# Patient Record
Sex: Female | Born: 1945
Health system: Southern US, Community
[De-identification: ages and names within clinical notes are randomized; demographics above are authoritative.]

## PROBLEM LIST (undated history)

## (undated) DIAGNOSIS — M952 Other acquired deformity of head: Secondary | ICD-10-CM

## (undated) DIAGNOSIS — M879 Osteonecrosis, unspecified: Principal | ICD-10-CM

## (undated) DIAGNOSIS — N6452 Nipple discharge: Secondary | ICD-10-CM

## (undated) DIAGNOSIS — N23 Unspecified renal colic: Secondary | ICD-10-CM

## (undated) DIAGNOSIS — K635 Polyp of colon: Secondary | ICD-10-CM

## (undated) DIAGNOSIS — R239 Unspecified skin changes: Secondary | ICD-10-CM

## (undated) DIAGNOSIS — C801 Malignant (primary) neoplasm, unspecified: Secondary | ICD-10-CM

## (undated) DIAGNOSIS — I10 Essential (primary) hypertension: Secondary | ICD-10-CM

## (undated) HISTORY — DX: Polyp of colon: K63.5

## (undated) HISTORY — PX: BREAST SURGERY: SHX581

## (undated) HISTORY — DX: Malignant (primary) neoplasm, unspecified: C80.1

## (undated) HISTORY — DX: Nipple discharge: N64.52

## (undated) HISTORY — PX: APPENDECTOMY: SHX54

## (undated) HISTORY — PX: TONSILLECTOMY: SUR1361

## (undated) HISTORY — PX: PARATHYROIDECTOMY: SHX19

---

## 1997-11-21 ENCOUNTER — Encounter: Payer: Self-pay | Admitting: Internal Medicine

## 1997-11-21 ENCOUNTER — Ambulatory Visit (HOSPITAL_COMMUNITY): Admission: RE | Admit: 1997-11-21 | Discharge: 1997-11-21 | Payer: Self-pay | Admitting: Internal Medicine

## 1998-02-23 ENCOUNTER — Other Ambulatory Visit: Admission: RE | Admit: 1998-02-23 | Discharge: 1998-02-23 | Payer: Self-pay | Admitting: Nurse Practitioner

## 2000-02-10 ENCOUNTER — Other Ambulatory Visit: Admission: RE | Admit: 2000-02-10 | Discharge: 2000-02-10 | Payer: Self-pay | Admitting: Internal Medicine

## 2000-03-26 ENCOUNTER — Ambulatory Visit (HOSPITAL_BASED_OUTPATIENT_CLINIC_OR_DEPARTMENT_OTHER): Admission: RE | Admit: 2000-03-26 | Discharge: 2000-03-26 | Payer: Self-pay | Admitting: General Surgery

## 2000-03-26 ENCOUNTER — Encounter (INDEPENDENT_AMBULATORY_CARE_PROVIDER_SITE_OTHER): Payer: Self-pay | Admitting: Specialist

## 2001-09-15 ENCOUNTER — Ambulatory Visit (HOSPITAL_BASED_OUTPATIENT_CLINIC_OR_DEPARTMENT_OTHER): Admission: RE | Admit: 2001-09-15 | Discharge: 2001-09-15 | Payer: Self-pay | Admitting: Urology

## 2002-04-06 ENCOUNTER — Emergency Department (HOSPITAL_COMMUNITY): Admission: EM | Admit: 2002-04-06 | Discharge: 2002-04-06 | Payer: Self-pay | Admitting: Emergency Medicine

## 2004-10-21 ENCOUNTER — Other Ambulatory Visit: Admission: RE | Admit: 2004-10-21 | Discharge: 2004-10-21 | Payer: Self-pay | Admitting: Obstetrics and Gynecology

## 2004-12-25 ENCOUNTER — Encounter (INDEPENDENT_AMBULATORY_CARE_PROVIDER_SITE_OTHER): Payer: Self-pay | Admitting: *Deleted

## 2004-12-25 ENCOUNTER — Ambulatory Visit (HOSPITAL_COMMUNITY): Admission: RE | Admit: 2004-12-25 | Discharge: 2004-12-25 | Payer: Self-pay | Admitting: Gastroenterology

## 2005-12-15 ENCOUNTER — Other Ambulatory Visit: Admission: RE | Admit: 2005-12-15 | Discharge: 2005-12-15 | Payer: Self-pay | Admitting: Obstetrics and Gynecology

## 2007-01-18 ENCOUNTER — Other Ambulatory Visit: Admission: RE | Admit: 2007-01-18 | Discharge: 2007-01-18 | Payer: Self-pay | Admitting: Obstetrics and Gynecology

## 2007-02-08 ENCOUNTER — Encounter: Admission: RE | Admit: 2007-02-08 | Discharge: 2007-02-08 | Payer: Self-pay | Admitting: Endocrinology

## 2007-04-08 ENCOUNTER — Encounter (INDEPENDENT_AMBULATORY_CARE_PROVIDER_SITE_OTHER): Payer: Self-pay | Admitting: Surgery

## 2007-04-08 ENCOUNTER — Ambulatory Visit (HOSPITAL_COMMUNITY): Admission: RE | Admit: 2007-04-08 | Discharge: 2007-04-09 | Payer: Self-pay | Admitting: Surgery

## 2010-05-01 ENCOUNTER — Other Ambulatory Visit: Payer: Self-pay | Admitting: Radiology

## 2010-05-02 ENCOUNTER — Other Ambulatory Visit: Payer: Self-pay | Admitting: Radiology

## 2010-05-02 DIAGNOSIS — C50911 Malignant neoplasm of unspecified site of right female breast: Secondary | ICD-10-CM

## 2010-05-06 ENCOUNTER — Other Ambulatory Visit: Payer: Self-pay

## 2010-05-08 ENCOUNTER — Ambulatory Visit
Admission: RE | Admit: 2010-05-08 | Discharge: 2010-05-08 | Disposition: A | Discharge status: Home / Self Care | Payer: MEDICARE | Source: Ambulatory Visit | Attending: Radiology | Admitting: Radiology

## 2010-05-08 DIAGNOSIS — C50911 Malignant neoplasm of unspecified site of right female breast: Secondary | ICD-10-CM

## 2010-05-08 MED ORDER — GADOBENATE DIMEGLUMINE 529 MG/ML IV SOLN
18.0000 mL | Freq: Once | INTRAVENOUS | Status: AC | PRN
  Administered 2010-05-08: 18 mL via INTRAVENOUS

## 2010-05-15 ENCOUNTER — Other Ambulatory Visit: Payer: Self-pay | Admitting: Oncology

## 2010-05-15 ENCOUNTER — Other Ambulatory Visit (HOSPITAL_COMMUNITY): Payer: Self-pay | Admitting: General Surgery

## 2010-05-15 ENCOUNTER — Encounter (HOSPITAL_BASED_OUTPATIENT_CLINIC_OR_DEPARTMENT_OTHER): Payer: Medicare Other | Admitting: Oncology

## 2010-05-15 DIAGNOSIS — C50911 Malignant neoplasm of unspecified site of right female breast: Secondary | ICD-10-CM

## 2010-05-15 DIAGNOSIS — C50419 Malignant neoplasm of upper-outer quadrant of unspecified female breast: Secondary | ICD-10-CM

## 2010-05-15 LAB — COMPREHENSIVE METABOLIC PANEL
Albumin: 4.5 g/dL (ref 3.5–5.2)
Alkaline Phosphatase: 97 U/L (ref 39–117)
BUN: 19 mg/dL (ref 6–23)
CO2: 29 mEq/L (ref 19–32)
Calcium: 10.1 mg/dL (ref 8.4–10.5)
Chloride: 100 mEq/L (ref 96–112)
Glucose, Bld: 88 mg/dL (ref 70–99)
Potassium: 4.1 mEq/L (ref 3.5–5.3)
Sodium: 137 mEq/L (ref 135–145)
Total Protein: 7 g/dL (ref 6.0–8.3)

## 2010-05-15 LAB — CANCER ANTIGEN 27.29: CA 27.29: 13 U/mL (ref 0–39)

## 2010-05-15 LAB — CBC WITH DIFFERENTIAL/PLATELET
BASO%: 0.3 % (ref 0.0–2.0)
Basophils Absolute: 0 10*3/uL (ref 0.0–0.1)
EOS%: 1.3 % (ref 0.0–7.0)
Eosinophils Absolute: 0.1 10*3/uL (ref 0.0–0.5)
HCT: 44.9 % (ref 34.8–46.6)
HGB: 15.2 g/dL (ref 11.6–15.9)
LYMPH%: 20.7 % (ref 14.0–49.7)
MCH: 30.9 pg (ref 25.1–34.0)
MCHC: 33.9 g/dL (ref 31.5–36.0)
MCV: 91.1 fL (ref 79.5–101.0)
MONO#: 0.7 10*3/uL (ref 0.1–0.9)
MONO%: 7.2 % (ref 0.0–14.0)
NEUT#: 7.4 10*3/uL — ABNORMAL HIGH (ref 1.5–6.5)
NEUT%: 70.5 % (ref 38.4–76.8)
Platelets: 214 10*3/uL (ref 145–400)
RBC: 4.93 10*6/uL (ref 3.70–5.45)
RDW: 13.9 % (ref 11.2–14.5)
WBC: 10.4 10*3/uL — ABNORMAL HIGH (ref 3.9–10.3)
lymph#: 2.2 10*3/uL (ref 0.9–3.3)

## 2010-05-16 ENCOUNTER — Ambulatory Visit (HOSPITAL_COMMUNITY)
Admission: RE | Admit: 2010-05-16 | Discharge: 2010-05-16 | Disposition: A | Discharge status: Home / Self Care | Payer: Medicare Other | Source: Ambulatory Visit | Attending: General Surgery | Admitting: General Surgery

## 2010-05-16 ENCOUNTER — Other Ambulatory Visit: Payer: Self-pay | Admitting: General Surgery

## 2010-05-16 ENCOUNTER — Ambulatory Visit (HOSPITAL_COMMUNITY): Payer: Medicare Other

## 2010-05-16 DIAGNOSIS — I10 Essential (primary) hypertension: Secondary | ICD-10-CM | POA: Insufficient documentation

## 2010-05-16 DIAGNOSIS — Z01818 Encounter for other preprocedural examination: Secondary | ICD-10-CM | POA: Insufficient documentation

## 2010-05-16 DIAGNOSIS — C50919 Malignant neoplasm of unspecified site of unspecified female breast: Secondary | ICD-10-CM | POA: Insufficient documentation

## 2010-05-16 DIAGNOSIS — C50911 Malignant neoplasm of unspecified site of right female breast: Secondary | ICD-10-CM

## 2010-05-16 LAB — SURGICAL PCR SCREEN: Staphylococcus aureus: NEGATIVE

## 2010-05-16 MED ORDER — TECHNETIUM TC 99M SULFUR COLLOID FILTERED
1.0000 | Freq: Once | INTRAVENOUS | Status: AC | PRN
  Administered 2010-05-16: 1 via INTRADERMAL

## 2010-05-21 NOTE — Op Note (Signed)
Julie [name], Julie [name]                 ACCOUNT NO.:  [id number]   MEDICAL RECORD NO.:  [id number]          PATIENT TYPE:  AMB   LOCATION:  DAY                          FACILITY:  [facility]   PHYSICIAN:  Velora Heckler, MD      DATE OF BIRTH:  Aug 18, 1945   DATE OF PROCEDURE:  04/08/2007  DATE OF DISCHARGE:                               OPERATIVE REPORT   PREOPERATIVE DIAGNOSIS:  Primary hyperparathyroidism.   POSTOPERATIVE DIAGNOSIS:  Primary hyperparathyroidism.   PROCEDURE:  Right inferior minimally-invasive parathyroidectomy.   SURGEON:  Velora Heckler, MD, FACS   ANESTHESIA:  General per Jenelle Mages. Fortune, MD   ESTIMATED BLOOD LOSS:  Minimal.   PREPARATION:  Betadine.   COMPLICATIONS:  None.   INDICATIONS:  The patient is a 65 year old white female from [place],  [place].  The patient was noted to have elevated serum calcium  levels ranging from 10.9 to 11.2.  Intact PTH level was checked and was  elevated at 99.  Bone density scanning showed osteopenia.  Sestamibi  scan was obtained and showed a right inferior adenoma on 1-hour delayed  views.  The patient now comes to surgery for minimally-invasive  parathyroidectomy.   Body.   OF REPORT:  The procedure was done in OR #12 at the [facility].  The patient is brought to the operating room,  placed in a supine position on the operating room table.  Following  administration of general anesthesia, the patient is positioned and then  prepped and draped in the usual strict aseptic fashion.  After  ascertaining that an adequate level of anesthesia had been achieved, an  inferior right neck incision is made with a #15 blade and dissection is  carried through subcutaneous tissues and platysma.  Hemostasis is  obtained with the electrocautery.  Subplatysmal flaps are developed  circumferentially.  A Weitlaner retractor is placed for exposure.  Strap  muscles are incised in the midline and reflected  laterally.  Inferior  pole of the thyroid is identified and mobilized.  Venous tributaries are  divided between small and medium ligaclips.  Dissection in the  tracheoesophageal groove reveals a prominent thyrothymic tract.  The  tract is dissected out and opened.  It contains what appears to be an  enlarged parathyroid gland.  It is moderately firm.  The gland is  resected in its entirety.  Thyrothymic tract is divided between medium  ligaclips.  Gland is excised and submitted fresh to pathology for frozen  section.  Dr. Guerry Bruin performed frozen section and confirms  parathyroid tissue which would be consistent with parathyroid adenoma.  Good hemostasis is obtained in the neck.  Surgicel is placed in the  operative field.  Strap muscles were reapproximated in the midline with  interrupted 3-0 Vicryl sutures.  Platysma is closed with interrupted 3-0  Vicryl sutures.  Skin is anesthetized with local anesthetic.  Skin is  closed with a running 4-0 Monocryl subcuticular suture.  Wound is washed and dried and Benzoin and Steri-Strips are applied.  Sterile dressings are applied.  The  patient is awakened from anesthesia  and brought to the recovery room in stable condition.  The patient  tolerated the procedure well.      Velora Heckler, MD  Electronically Signed     TMG/MEDQ  D:  04/08/2007  T:  04/08/2007  Job:  [id number]   cc:   [name], M.D.  Fax: [phone]   Dorisann Frames, M.D.  Fax: [phone]

## 2010-05-24 NOTE — Op Note (Signed)
Julie [name], Julie [name]                          ACCOUNT NO.:  [id number]   MEDICAL RECORD NO.:  [id number]                   PATIENT TYPE:  AMB   LOCATION:  N[facility]C                                 FACILITY:  [facility]   PHYSICIAN:  Mark C. Vernie Ammons, M.D.               DATE OF BIRTH:  February 14, 1945   DATE OF PROCEDURE:  09/15/2001  DATE OF DISCHARGE:                                 OPERATIVE REPORT   PREOPERATIVE DIAGNOSES:  1. Left hydronephrosis.  2. Ureteral obstruction.   POSTOPERATIVE DIAGNOSES:  1. Left hydronephrosis.  2. Ureteral obstruction.  3. Suspected acute cystitis.   PROCEDURE:  1. Cystoscopy.  2. Left retrograde pyelogram with interpretation.  3. Double-J stent placement.   SURGEON:  Mark C. Vernie Ammons, M.D.   ANESTHESIA:  General.   DRAINS:  6 French 24 cm double-J stent in the left ureter with string.   ESTIMATED BLOOD LOSS:  Less than 5 cc.   SPECIMENS:  Catheterized urine for culture and sensitivity.   COMPLICATIONS:  None.   INDICATIONS FOR PROCEDURE:  The patient is a 65 year old white female, who  presented tomy office yesterday with acute left flank pain, nausea and  vomiting, and an IVP revealed high-grade obstruction on the left-hand side  with pyelocaliectasis.  I could not see a stone.  She returned this morning  and had a miserable night with nausea and vomiting and, I therefore  discussed further evaluation for what I thought was a stone at the time.  Her urinalysis did not look particularly infected in my office, and she was  afebrile at that time as well as today when she arrived for surgery.   DESCRIPTION OF OPERATION:  After informed consent, the patient brought to  the major OR, placed on the table, administered general anesthesia, then  moved to the dorsal lithotomy position.  Genitalia was sterilely prepped and  draped.  I noted a cystocele but a normal-appearing urethral meatus.  When I  inserted the cystoscope into the bladder, foul-smelling  urine returned.  That was obtained for cultures and sensitivity.  The patient did receive 400  mg of Cipro preoperatively.   Inspection of the bladder with the cystoscope revealed multiple red patches  throughout, consistent with acute cystitis.  I did not see any papillary  tumors or other worrisome findings.  The left orifice was identified, and a  5 [place] open-ended ureteral catheter was placed in the opening, and a  retrograde pyelogram was performed in the standard fashion under direct  fluoroscopy.  It revealed normal-appearing ureter of normal caliber and also  what appeared to be no obstruction at this time.  The caliceal system  appeared sharp and unobstructed, but the ureter appeared to have a narrowed  appearance, most consistent with some inflammation.   It was my feeling that ureteroscopy should not be performed in the presence  of suspected bladder infection, and I  therefore passed a 0.038 inch floppy-  tip guidewire through the opening in ureteral catheter and then removed that  catheter and inserted her double-J stent.  Good curl was noted in the renal  pelvis and the bladder, and the patient was then awakened and taken to  recovery room in stable and satisfactory condition.   She will be given a prescription for 500 mg Levaquin to take once a day #14,  Ditropan XL 15 mg q.d. p.r.n. for bladder spasms, and will return to my  office in one week for follow-up.                                               Mark C. Vernie Ammons, M.D.    MCO/MEDQ  D:  09/15/2001  T:  09/16/2001  Job:  [id number]

## 2010-05-24 NOTE — Op Note (Signed)
NAME:  Julie [name], THREATS                 ACCOUNT NO.:  [id number]   MEDICAL RECORD NO.:  [id number]          PATIENT TYPE:  AMB   LOCATION:  ENDO                         FACILITY:  [facility]   PHYSICIAN:  Anselmo Rod, M.D.  DATE OF BIRTH:  [birth year]   DATE OF PROCEDURE:  12/25/2004  DATE OF DISCHARGE:                                 OPERATIVE REPORT   PROCEDURE:  Colonoscopy with snare polypectomy x1 and cold biopsies x4.   ENDOSCOPIST:  Anselmo Rod, M.D.   INSTRUMENT:  Olympus videocolonoscope.   INDICATIONS FOR PROCEDURE:  A 65 year old white female underwent a screening  colonoscopy to rule out chronic polyps, masses, etcetera.   PREPROCEDURE PREPARATION:  Informed consent was procured from the patient.  The patient was fasted for 4 hours prior to the procedure and prepped with  OsmoPrep pills the night of and the morning of the procedure. The risks and  benefits of the procedure including a 10% missed rate of cancer and polyps  was discussed with the patient as well.   PREPROCEDURE PHYSICAL:  VITAL SIGNS: The patient with stable vital signs.  NECK:  Supple.  CHEST:  Clear to auscultation.  HEART:  S1, S2 regular.  ABDOMEN:  Soft with normal bowel sounds.   DESCRIPTION OF PROCEDURE:  The patient was placed in the left lateral  decubitus position and sedated with 75 mcg of Fentanyl and 7.5 mg of Versed  in slow incremental doses. Once the patient was adequately sedated and  maintained on low-flow oxygen, and continuous cardiac monitoring; the  Olympus videocolonoscope was advanced from the rectum to the cecum.  The  appendicular orifice and ileocecal valve were clearly visualized and  photographed.  The terminal ileum appeared normal.  A small sessile polyp  was removed by cold biopsy from the mid right colon. Two small sessile  polyps were biopsied from 90 cm (cold biopsied x2).  A larger sessile polyp  was snared from 90 cm (cold snared x1).  A small sessile polyp was  removed  from the rectum (cold biopsied x1).  Retroflexion in the rectum revealed no abnormalities.  The patient tolerated  the procedure well without complications.  There was no evidence of  diverticulosis.   IMPRESSION:  1.  Multiple small sessile polyps, removed by cold biopsy x4 from the      rectum, one from the mid right colon, and two from 90 cm.  2.  A larger sessile polyp removed by cold snare from 90 cm.   RECOMMENDATIONS:  1.  Await pathology results.  2.  Avoid nonsteroidals including aspirin for the next 4 weeks.  3.  Repeat colonoscopy depending on pathology results.  4.  Outpatient follow up as need arises in the future.      Anselmo Rod, M.D.  Electronically Signed     JNM/MEDQ  D:  12/25/2004  T:  12/27/2004  Job:  [id number]   cc:   Remonia Richter, FNP   Edwena Felty. Romine, M.D.  Fax: [phone]

## 2010-05-29 ENCOUNTER — Ambulatory Visit
Admission: RE | Admit: 2010-05-29 | Discharge: 2010-05-29 | Disposition: A | Discharge status: Home / Self Care | Payer: Medicare Other | Source: Ambulatory Visit | Attending: Radiation Oncology | Admitting: Radiation Oncology

## 2010-05-29 DIAGNOSIS — L589 Radiodermatitis, unspecified: Secondary | ICD-10-CM | POA: Insufficient documentation

## 2010-05-29 DIAGNOSIS — Y842 Radiological procedure and radiotherapy as the cause of abnormal reaction of the patient, or of later complication, without mention of misadventure at the time of the procedure: Secondary | ICD-10-CM | POA: Insufficient documentation

## 2010-05-29 DIAGNOSIS — Z170 Estrogen receptor positive status [ER+]: Secondary | ICD-10-CM | POA: Insufficient documentation

## 2010-05-29 DIAGNOSIS — Z510 Encounter for antineoplastic radiation therapy: Secondary | ICD-10-CM | POA: Insufficient documentation

## 2010-05-29 DIAGNOSIS — N6459 Other signs and symptoms in breast: Secondary | ICD-10-CM | POA: Insufficient documentation

## 2010-05-29 DIAGNOSIS — C50919 Malignant neoplasm of unspecified site of unspecified female breast: Secondary | ICD-10-CM | POA: Insufficient documentation

## 2010-06-06 NOTE — Op Note (Signed)
Julie [name], Julie [name]                 ACCOUNT NO.:  [id number]  MEDICAL RECORD NO.:  [id number]           PATIENT TYPE:  O  LOCATION:  SDSC                         FACILITY:  [facility]  PHYSICIAN:  [name], MD       DATE OF BIRTH:  06-16-1945  DATE OF PROCEDURE:  05/16/2010 DATE OF DISCHARGE:  05/16/2010                              OPERATIVE REPORT   PREOPERATIVE DIAGNOSIS:  Right breast cancer, clinical T1 N0 MX.  POSTOPERATIVE DIAGNOSIS:  Right breast cancer, clinical T1 N0 MX.  PROCEDURE:  Right needle-localized partial mastectomy, right lymphatic mapping, and right axillary sentinel lymph node biopsy.  SURGEON:  [name], MD  ASSISTANT:  None.  ANESTHESIA:  General and local.  FINDINGS:  Two sentinel lymph nodes, #1 was hot with counts per second of approximately 1100.  #2 was palpable, not hot or blue, background was around 50 counts per second. Clip, wire, and mass in the specimen by Faxitron, confirmed by Dr. Rogers Blocker of [facility].  SPECIMENS:  Right axillary sentinel lymph node 1 and 2 and right breast partial mastectomy.  ESTIMATED BLOOD LOSS:  Minimal.  COMPLICATIONS:  None known.  INDICATIONS FOR PROCEDURE:  Ms. Picazo is a 65 year old female who presented with a screening detected right breast mass.  Under biopsy, this was demonstrated to be invasive mammary carcinoma.  She underwent MRI demonstrating no additional suspicious lesions.  She had a mass localized and is for breast conservation treatment today.  PROCEDURE:  Ms. Mcclarty was identified in the holding area and taken to the operating room where she was placed supine on the operating room table.  General anesthesia was induced.  Time-out was performed according to the surgical safety check list.  When all was correct, we continued.  The right breast was injected with blue dye.  The right chest, breasts, and upper arm were prepped and draped in standard fashion.  Time-out was performed  according to the surgical safety check list.  When all was correct, we continued.  The axilla was examined with the NeoProbe, and the region overlying the highest counts per second was opened with a #15 blade.  This was directly at the hairline of the axilla.  The incision made was approximately 3 cm in length.  The subcutaneous tissue in the clavipectoral fascia were divided with the cautery.  A Weitlaner retractor was used to assist with visualization. The NeoProbe was then used to identify the area of maximum intensity and a tonsil was used to bluntly dissect the tissue.  Once a palpable lymph node was identified, this was elevated with a Babcock and dissected with the tonsil.  The clips were placed on the lymphovascular channels going to the lymph node.  Cautery was then used to take the node off these. The counts per second on this node were 1100 approximately.  This was passed off the table.  The axilla was reexamined and the counts per second were 150.  There was one small palpable node that could be located, and this was taken down in a similar fashion.  This was not hot or blue based  on examination.  The axilla was reexamined and no additional palpable or blue nodes were found.  The background remainder 150.  The cavity was then irrigated and hemostasis was achieved with cautery.  The skin was reapproximated with 3-0 Vicryl deep dermal interrupted sutures and 4-0 Monocryl running subcuticular sutures.  The attention was then directed to the breast.  The mammograms were examined and appropriate site for curvilinear incision was located.  This was done in the upper outer quadrant.  The subcutaneous tissues were divided with the cautery and skin hooks were used to elevate the skin and create flaps.  The lateral flap, superior and inferior flap were created thicker.  The medial flap was slightly thinner in order to encompass the graft.  The wire was located with a tonsil and pulled  through the skin. The breast tissue was elevated with two Babcock clamps.  The Mayos were then used to come around the specimen.  This was marked with margin marker paint kit.  Once this was done, it was placed into the Faxitron mammographic specimen container and specimen radiograph was performed. This confirmed that the clip wire and mass were in the tissue.  This was confirmed by Dr. Rogers Blocker as well as Radiology.  The cavity was then irrigated, and hemostasis was achieved with the cautery.  The wound was then closed with 3-0 Vicryl deep dermal sutures and 4-0 Monocryl running subcuticular sutures.  The wounds were then dressed with Steri-Strips gauze and a breast binder.  The patient tolerated the procedure well. She was awakened from anesthesia and taken to the PACU in stable condition.  Needle, sponge, and instrument counts were correct.     [name], MD     FB/MEDQ  D:  05/16/2010  T:  05/17/2010  Job:  [id number]  Electronically Signed by [name] MD on 06/06/2010 10:18:28 AM

## 2010-06-14 ENCOUNTER — Encounter (HOSPITAL_BASED_OUTPATIENT_CLINIC_OR_DEPARTMENT_OTHER): Payer: Medicare Other | Admitting: Oncology

## 2010-06-14 DIAGNOSIS — C50419 Malignant neoplasm of upper-outer quadrant of unspecified female breast: Secondary | ICD-10-CM

## 2010-06-14 DIAGNOSIS — Z170 Estrogen receptor positive status [ER+]: Secondary | ICD-10-CM

## 2010-07-11 ENCOUNTER — Ambulatory Visit
Admission: RE | Admit: 2010-07-11 | Discharge: 2010-07-11 | Disposition: A | Discharge status: Home / Self Care | Payer: Medicare Other | Source: Ambulatory Visit | Admitting: Radiation Oncology

## 2010-08-01 ENCOUNTER — Ambulatory Visit
Admission: RE | Admit: 2010-08-01 | Discharge: 2010-08-01 | Disposition: A | Discharge status: Home / Self Care | Payer: Medicare Other | Source: Ambulatory Visit | Attending: Radiation Oncology | Admitting: Radiation Oncology

## 2010-08-19 ENCOUNTER — Other Ambulatory Visit: Payer: Self-pay | Admitting: Gastroenterology

## 2010-08-21 ENCOUNTER — Encounter (INDEPENDENT_AMBULATORY_CARE_PROVIDER_SITE_OTHER): Payer: Self-pay | Admitting: General Surgery

## 2010-08-21 ENCOUNTER — Ambulatory Visit (INDEPENDENT_AMBULATORY_CARE_PROVIDER_SITE_OTHER): Payer: Medicare Other | Admitting: General Surgery

## 2010-08-21 DIAGNOSIS — C50311 Malignant neoplasm of lower-inner quadrant of right female breast: Secondary | ICD-10-CM | POA: Insufficient documentation

## 2010-08-21 DIAGNOSIS — C50911 Malignant neoplasm of unspecified site of right female breast: Secondary | ICD-10-CM

## 2010-08-21 DIAGNOSIS — C50919 Malignant neoplasm of unspecified site of unspecified female breast: Secondary | ICD-10-CM

## 2010-08-21 NOTE — Assessment & Plan Note (Signed)
R nipple discharge after BCT and radiation. Discharge improving.   Pt doing well otherwise. Follow up with a breast cancer provider every 3 months for 2 years, then every 6 months times 3 years.   Continue anastrazole.

## 2010-08-21 NOTE — Progress Notes (Signed)
Subjective:     Patient ID: Julie [name], female   DOB: 08/22/1945, 65 y.o.   MRN: [id number]  HPI Patient doing well status post breast conservation therapy in May of 2012. She is finished her radiation and is on anastrozole. She has turned having occasional hot flashes. She continues to have some nipple discharge from the right side. This has started to decrease significantly. She had some soreness of the nipple when she bumps it. Her energy has continued to improve.    Review of Systems  All other systems reviewed and are negative.       Objective:   Physical Exam  Constitutional: She is oriented to person, place, and time. She appears well-developed and well-nourished.  HENT:  Head: Normocephalic and atraumatic.  Mouth/Throat: No oropharyngeal exudate.  Eyes: Conjunctivae are normal. Pupils are equal, round, and reactive to light. No scleral icterus.  Neck: Normal range of motion. Neck supple. No tracheal deviation present. No thyromegaly present.  Cardiovascular: Normal rate and regular rhythm.   Pulmonary/Chest: Effort normal. No respiratory distress. She exhibits no tenderness.         Small seroma and area of tenderness.  Abdominal: Soft. She exhibits no distension and no mass. There is no tenderness. There is no rebound and no guarding.  Musculoskeletal: Normal range of motion. She exhibits no edema and no tenderness.  Lymphadenopathy:    She has no cervical adenopathy.  Neurological: She is alert and oriented to person, place, and time. Coordination normal.  Skin: Skin is warm and dry. No rash noted. No erythema. No pallor.  Psychiatric: She has a normal mood and affect. Her behavior is normal. Judgment and thought content normal.       Assessment:     R breast cancer, pT1bN0M0 ER/PR+, her 2neg, ki67 10%, s/p BCT 05/16/2010 R nipple discharge after BCT and radiation. Discharge improving.   Pt doing well otherwise. Follow up with a breast cancer provider every 3  months for 2 years, then every 6 months times 3 years.   Continue anastrazole.        Plan:

## 2010-09-30 LAB — URINALYSIS, ROUTINE W REFLEX MICROSCOPIC
Bilirubin Urine: NEGATIVE
Hgb urine dipstick: NEGATIVE
Ketones, ur: NEGATIVE
Specific Gravity, Urine: 1.013
Urobilinogen, UA: 0.2

## 2010-09-30 LAB — DIFFERENTIAL
Basophils Relative: 1
Eosinophils Absolute: 0.2
Eosinophils Relative: 3
Lymphs Abs: 3.1
Neutrophils Relative %: 53

## 2010-09-30 LAB — BASIC METABOLIC PANEL
BUN: 14
CO2: 26
Chloride: 104
Creatinine, Ser: 0.66
Glucose, Bld: 93
Potassium: 4.4

## 2010-09-30 LAB — PROTIME-INR: Prothrombin Time: 13.1

## 2010-09-30 LAB — URINE MICROSCOPIC-ADD ON

## 2010-09-30 LAB — CBC
HCT: 42.4
MCHC: 34.9
MCV: 84.6
Platelets: 226
RDW: 13.8
WBC: 8.6

## 2010-10-01 LAB — CALCIUM: Calcium: 9.2

## 2010-10-04 ENCOUNTER — Other Ambulatory Visit: Payer: Self-pay | Admitting: Oncology

## 2010-10-04 ENCOUNTER — Encounter (HOSPITAL_BASED_OUTPATIENT_CLINIC_OR_DEPARTMENT_OTHER): Payer: Medicare Other | Admitting: Oncology

## 2010-10-04 DIAGNOSIS — C50419 Malignant neoplasm of upper-outer quadrant of unspecified female breast: Secondary | ICD-10-CM

## 2010-10-04 LAB — CBC WITH DIFFERENTIAL/PLATELET
BASO%: 0.6 % (ref 0.0–2.0)
HCT: 42.9 % (ref 34.8–46.6)
MCHC: 34.4 g/dL (ref 31.5–36.0)
MONO#: 0.4 10*3/uL (ref 0.1–0.9)
NEUT#: 4.3 10*3/uL (ref 1.5–6.5)
RBC: 4.72 10*6/uL (ref 3.70–5.45)
WBC: 6.8 10*3/uL (ref 3.9–10.3)
lymph#: 1.8 10*3/uL (ref 0.9–3.3)

## 2010-10-06 LAB — COMPREHENSIVE METABOLIC PANEL
ALT: 26 U/L (ref 0–35)
Albumin: 4.4 g/dL (ref 3.5–5.2)
CO2: 23 mEq/L (ref 19–32)
Calcium: 9.5 mg/dL (ref 8.4–10.5)
Chloride: 106 mEq/L (ref 96–112)
Sodium: 141 mEq/L (ref 135–145)
Total Protein: 7 g/dL (ref 6.0–8.3)

## 2010-10-06 LAB — VITAMIN D 25 HYDROXY (VIT D DEFICIENCY, FRACTURES): Vit D, 25-Hydroxy: 56 ng/mL (ref 30–89)

## 2011-01-16 ENCOUNTER — Encounter: Payer: Self-pay | Admitting: Radiation Oncology

## 2011-01-16 ENCOUNTER — Telehealth: Payer: Self-pay | Admitting: *Deleted

## 2011-01-16 ENCOUNTER — Ambulatory Visit
Admission: RE | Admit: 2011-01-16 | Discharge: 2011-01-16 | Disposition: A | Discharge status: Home / Self Care | Payer: Medicare Other | Source: Ambulatory Visit | Attending: Radiation Oncology | Admitting: Radiation Oncology

## 2011-01-16 DIAGNOSIS — C50911 Malignant neoplasm of unspecified site of right female breast: Secondary | ICD-10-CM

## 2011-01-16 NOTE — Progress Notes (Signed)
C/o pain on right side at breast, feels like a pulled muscle.  Only feels when she touches it or lays on it.  Skin looks great, do not see anything at area she has pain

## 2011-01-20 NOTE — Progress Notes (Signed)
CC:   Pierce Crane, M.D., F.R.C.P.C. [name], MD [name], M.D.  DIAGNOSIS:  T1b N0 infiltrating ductal carcinoma of the right breast.  PREVIOUS RADIATION:  Adjuvant radiation to a total dose of 42.72 Gy, completed 07/04/2010.  INTERVAL SINCE TREATMENT:  6 months.  INTERVAL HISTORY:  Julie [name] reports for followup today.  She is doing well. Her breast is no longer draining.  She is tolerating her antiestrogen well.  She does have some pulling-type pain when she lays down her right breast or when she uses it a lot.  She describes this as a deep pain.  PHYSICAL EXAMINATION:  General:  She is a pleasant female in no distress, sitting comfortably on the exam room table.  Breasts:  Her right breast is well-healed.  She has no palpable abnormalities.  Her scar looks and feels good.  No palpable axillary adenopathy, no palpable left axillary adenopathy, and nothing palpable in the left breast.  IMPRESSION:  Stage I right breast cancer, status post radiation with no evidence of disease.  RECOMMENDATIONS:  Deriyah looks great.  We talked about the damage to muscles that sometimes occurs with radiation and decreased healing.  We talked about stretching and the use of anti-inflammatories.  I have released her from followup with me.  She is welcome to call at any point if she has any problems in the future.    ______________________________ Lurline Hare, M.D. SW/MEDQ  D:  01/20/2011  T:  01/20/2011  Job:  [id number]

## 2011-02-18 ENCOUNTER — Ambulatory Visit
Admission: RE | Admit: 2011-02-18 | Discharge: 2011-02-18 | Disposition: A | Discharge status: Home / Self Care | Payer: Medicare Other | Source: Ambulatory Visit | Attending: Radiation Oncology | Admitting: Radiation Oncology

## 2011-02-18 ENCOUNTER — Ambulatory Visit (HOSPITAL_COMMUNITY)
Admission: RE | Admit: 2011-02-18 | Discharge: 2011-02-18 | Disposition: A | Discharge status: Home / Self Care | Payer: Medicare Other | Source: Ambulatory Visit | Attending: Radiation Oncology | Admitting: Radiation Oncology

## 2011-02-18 ENCOUNTER — Encounter: Payer: Self-pay | Admitting: Radiation Oncology

## 2011-02-18 VITALS — BP 124/73 | HR 99 | Temp 98.5°F | Resp 18 | Wt 177.4 lb

## 2011-02-18 DIAGNOSIS — Z853 Personal history of malignant neoplasm of breast: Secondary | ICD-10-CM | POA: Insufficient documentation

## 2011-02-18 DIAGNOSIS — R079 Chest pain, unspecified: Secondary | ICD-10-CM | POA: Insufficient documentation

## 2011-02-18 DIAGNOSIS — C50911 Malignant neoplasm of unspecified site of right female breast: Secondary | ICD-10-CM

## 2011-02-18 DIAGNOSIS — I77819 Aortic ectasia, unspecified site: Secondary | ICD-10-CM | POA: Insufficient documentation

## 2011-02-18 NOTE — Progress Notes (Signed)
Here today due to finding a "knot" she found on right lateral chest, painful and radiates around to back when press on it or turn over on it while sleeping.

## 2011-02-28 NOTE — Telephone Encounter (Signed)
xxx

## 2011-03-17 ENCOUNTER — Encounter (INDEPENDENT_AMBULATORY_CARE_PROVIDER_SITE_OTHER): Payer: Self-pay | Admitting: General Surgery

## 2011-03-17 ENCOUNTER — Ambulatory Visit (INDEPENDENT_AMBULATORY_CARE_PROVIDER_SITE_OTHER): Payer: Medicare Other | Admitting: General Surgery

## 2011-03-17 VITALS — BP 128/86 | HR 84 | Temp 97.7°F | Resp 18 | Ht 63.0 in | Wt 177.4 lb

## 2011-03-17 DIAGNOSIS — C50919 Malignant neoplasm of unspecified site of unspecified female breast: Secondary | ICD-10-CM

## 2011-03-17 DIAGNOSIS — C50911 Malignant neoplasm of unspecified site of right female breast: Secondary | ICD-10-CM

## 2011-03-17 NOTE — Progress Notes (Signed)
HISTORY: Pt doing well.  She was previously having bad hot flashes with her arimidex, but these have improved.  She also was having nipple discharge after radiation, which resolved.  She denies breast pain or new masses.  She has not had any weight loss or other new health problems.  She denies issues with her bowels.  She denies fever/ chills.     PERTINENT REVIEW OF SYSTEMS: Otherwise negative.   EXAM: Head: Normocephalic and atraumatic.  Eyes:  Conjunctivae are normal. Pupils are equal, round, and reactive to light. No scleral icterus.  Neck:  Normal range of motion. Neck supple. No tracheal deviation present. No thyromegaly present.  Resp: No respiratory distress, normal effort. Breast:  No masses or skin dimpling found.  No nipple discharge or lymphadenopathy.  The right breast has some thickening at the scar.  She has some sensitivity at her scar.   Abd:  Abdomen is soft, non distended and non tender. No masses are palpable.  There is no rebound and no guarding.  Neurological: Alert and oriented to person, place, and time. Coordination normal.  Skin: Skin is warm and dry. No rash noted. No diaphoretic. No erythema. No pallor.  Psychiatric: Normal mood and affect. Normal behavior. Judgment and thought content normal.      ASSESSMENT AND PLAN:   R breast cancer, pT1bN0M0 ER/PR+, her 2neg, ki67 10%, s/p BCT 05/16/2010 No evidence of malignancy at this time. She will continue on Arimidex She is going to have mammogram soon. Follow up with me in 6 months.        Maudry Diego, MD Surgical Oncology, General & Endocrine Surgery [facility]  [place], PA, PA Yehuda Mao [place], [place]

## 2011-03-17 NOTE — Assessment & Plan Note (Signed)
No evidence of malignancy at this time. She will continue on Arimidex She is going to have mammogram soon. Follow up with me in 6 months.

## 2011-03-17 NOTE — Patient Instructions (Signed)
Follow up with me in 6 months.  Get follow up mammogram as scheduled.

## 2011-03-19 ENCOUNTER — Inpatient Hospital Stay: Admission: RE | Admit: 2011-03-19 | Payer: Medicare Other | Source: Ambulatory Visit | Admitting: Radiation Oncology

## 2011-03-19 ENCOUNTER — Other Ambulatory Visit: Payer: Medicare Other | Admitting: Lab

## 2011-03-20 ENCOUNTER — Ambulatory Visit
Admission: RE | Admit: 2011-03-20 | Discharge: 2011-03-20 | Disposition: A | Discharge status: Home / Self Care | Payer: Medicare Other | Source: Ambulatory Visit | Attending: Radiation Oncology | Admitting: Radiation Oncology

## 2011-03-20 ENCOUNTER — Other Ambulatory Visit: Payer: Self-pay | Admitting: *Deleted

## 2011-03-20 ENCOUNTER — Encounter: Payer: Self-pay | Admitting: Radiation Oncology

## 2011-03-20 VITALS — BP 137/79 | HR 98 | Temp 97.3°F | Resp 18 | Wt 179.1 lb

## 2011-03-20 DIAGNOSIS — C50911 Malignant neoplasm of unspecified site of right female breast: Secondary | ICD-10-CM

## 2011-03-20 NOTE — Progress Notes (Signed)
HERE TODAY FOR FU OF RIGHT BREAST CA.  SKIN LOOKS GREAT , NO C/O.  STATES THAT PAIN AND LUMP ON RIGHT SIDE IS GONE AT THIS POINT ALSO

## 2011-03-20 NOTE — Progress Notes (Signed)
CC:   Julie [name], M.D., F.R.C.P.C. [name], MD [name], M.D.  DIAGNOSIS:  T1b N0 infiltrating ductal carcinoma of the right breast. Previous radiation to a total dose of 42.72 Gy completed 07/04/2010.  INTERVAL SINCE TREATMENT:  6 months.  INTERVAL HISTORY:  Julie [name] reports for followup today.  I last saw her in January when she was complaining of some right breast pain.  We discussed stretching and anti-inflammatories.  That pain has resolved. She is feeling well and is on her antiestrogen with a followup appointment with Dr. Donnie Coffin tomorrow.  She is excited about her upcoming trip to [place] and would like that appointment moved.  She saw Dr. Donell Beers last week.  She has a mammogram scheduled in April.  PHYSICAL EXAMINATION:  General:  She is pleasant female in no distress, sitting comfortably on the exam room table.  Breasts:  Her right breast is well-healed.  Really not much palpable in and around her scar. Nothing palpable in the left breast.  No palpable axillary adenopathy bilaterally.  IMPRESSION:  No evidence of disease.  RECOMMENDATIONS:  [name] looks great.  I will see her back in 9 months. She will see Dr. Donell Beers and Dr. Donnie Coffin in the interim.  We will get her appointment rescheduled Dr. Donnie Coffin.    ______________________________ Lurline Hare, M.D. SW/MEDQ  D:  03/20/2011  T:  03/20/2011  Job:  [id number]

## 2011-03-21 ENCOUNTER — Other Ambulatory Visit: Payer: Medicare Other | Admitting: Lab

## 2011-03-21 ENCOUNTER — Ambulatory Visit: Payer: Medicare Other | Admitting: Oncology

## 2011-03-21 ENCOUNTER — Other Ambulatory Visit: Payer: Self-pay | Admitting: *Deleted

## 2011-03-24 ENCOUNTER — Telehealth: Payer: Self-pay | Admitting: *Deleted

## 2011-03-24 NOTE — Telephone Encounter (Signed)
gave patient appointment for 04-2011  patient  confirmed over the phone the new date and time

## 2011-05-06 ENCOUNTER — Ambulatory Visit: Payer: Medicare Other | Admitting: Oncology

## 2011-05-06 ENCOUNTER — Other Ambulatory Visit: Payer: Medicare Other | Admitting: Lab

## 2011-05-28 ENCOUNTER — Ambulatory Visit
Admission: RE | Admit: 2011-05-28 | Discharge: 2011-05-28 | Disposition: A | Discharge status: Home / Self Care | Payer: Medicare Other | Source: Ambulatory Visit | Attending: Family Medicine | Admitting: Family Medicine

## 2011-05-28 ENCOUNTER — Other Ambulatory Visit: Payer: Self-pay | Admitting: Family Medicine

## 2011-05-28 DIAGNOSIS — R059 Cough, unspecified: Secondary | ICD-10-CM

## 2011-05-28 DIAGNOSIS — R05 Cough: Secondary | ICD-10-CM

## 2011-07-07 ENCOUNTER — Telehealth: Payer: Self-pay | Admitting: *Deleted

## 2011-07-07 NOTE — Telephone Encounter (Signed)
patient aware of the appointment

## 2011-07-17 ENCOUNTER — Ambulatory Visit (HOSPITAL_BASED_OUTPATIENT_CLINIC_OR_DEPARTMENT_OTHER): Payer: Medicare Other | Admitting: Lab

## 2011-07-17 ENCOUNTER — Ambulatory Visit: Payer: Medicare Other | Admitting: Radiation Oncology

## 2011-07-17 ENCOUNTER — Ambulatory Visit (HOSPITAL_BASED_OUTPATIENT_CLINIC_OR_DEPARTMENT_OTHER): Payer: Medicare Other | Admitting: Oncology

## 2011-07-17 ENCOUNTER — Other Ambulatory Visit: Payer: Self-pay | Admitting: *Deleted

## 2011-07-17 VITALS — BP 144/79 | HR 103 | Temp 98.2°F | Ht 63.0 in | Wt 178.9 lb

## 2011-07-17 DIAGNOSIS — C50911 Malignant neoplasm of unspecified site of right female breast: Secondary | ICD-10-CM

## 2011-07-17 DIAGNOSIS — C50419 Malignant neoplasm of upper-outer quadrant of unspecified female breast: Secondary | ICD-10-CM

## 2011-07-17 DIAGNOSIS — E559 Vitamin D deficiency, unspecified: Secondary | ICD-10-CM

## 2011-07-17 DIAGNOSIS — Z170 Estrogen receptor positive status [ER+]: Secondary | ICD-10-CM

## 2011-07-17 LAB — CBC WITH DIFFERENTIAL/PLATELET
Basophils Absolute: 0.1 10*3/uL (ref 0.0–0.1)
Eosinophils Absolute: 0.3 10*3/uL (ref 0.0–0.5)
HGB: 14.3 g/dL (ref 11.6–15.9)
MCV: 90.6 fL (ref 79.5–101.0)
MONO#: 0.6 10*3/uL (ref 0.1–0.9)
MONO%: 8.3 % (ref 0.0–14.0)
NEUT#: 3.9 10*3/uL (ref 1.5–6.5)
Platelets: 182 10*3/uL (ref 145–400)
RBC: 4.7 10*6/uL (ref 3.70–5.45)
RDW: 13.4 % (ref 11.2–14.5)
WBC: 7 10*3/uL (ref 3.9–10.3)

## 2011-07-17 MED ORDER — ANASTROZOLE 1 MG PO TABS: 1.0000 mg | ORAL_TABLET | Freq: Every day | ORAL | Status: DC

## 2011-07-17 NOTE — Progress Notes (Signed)
Hematology and Oncology Follow Up Visit  Julie [name] [id number] 04-Feb-1945 66 y.o. 07/21/2011 1:37 PM   DIAGNOSIS:   Encounter Diagnoses  Name Primary?  . R breast cancer, pT1bN0M0 ER/PR+, her 2neg, ki67 10%, s/p BCT 05/16/2010 Yes  . Unspecified vitamin D deficiency      PAST THERAPY: As above, ration therapy completed 06/26/2010, currently on Arimidex  Interim History:  She has been feeling well. She has no complaints. She is on the radiation oncologist the other day. She is planning a trip to [place]. She is also recently seen the surgeon. She also has a had a mammogram in April which was normal. She is taking additional vitamin D.  Medications: I have reviewed the patient's current medications.  Allergies: No Known Allergies  Past Medical History, Surgical history, Social history, and Family History were reviewed and updated.  Review of Systems: Constitutional:  Negative for fever, chills, night sweats, anorexia, weight loss, pain. Cardiovascular: no chest pain or dyspnea on exertion Respiratory: no cough, shortness of breath, or wheezing Neurological: negative Dermatological: negative ENT: negative Skin Gastrointestinal: negative Genito-Urinary: negative Hematological and Lymphatic: negative Breast: negative Musculoskeletal: negative Remaining ROS negative.  Physical Exam:  Blood pressure 144/79, pulse 103, temperature 98.2 F (36.8 C), height 5\' 3"  (1.6 m), weight 178 lb 14.4 oz (81.149 kg).  ECOG: 0 Physical Exam:  Blood pressure 144/79, pulse 103, temperature 98.2 F (36.8 C), height 5\' 3"  (1.6 m), weight 178 lb 14.4 oz (81.149 kg).  ECOG: 0  HEENT:  Sclerae anicteric, conjunctivae pink.  Oropharynx clear.  No mucositis or candidiasis.  Nodes:  No cervical, supraclavicular, or axillary lymphadenopathy palpated.  Breast Exam:  Right breast is status post lumpectomy and radiation without evidence of recurrence  No masses, discharge, skin change, or  nipple inversion.  Left breast is benign.  No masses, discharge, skin change, or nipple inversion..  Lungs:  Clear to auscultation bilaterally.  No crackles, rhonchi, or wheezes.  Heart:  Regular rate and rhythm.  Abdomen:  Soft, nontender.  Positive bowel sounds.  No organomegaly or masses palpated.  Musculoskeletal:  No focal spinal tenderness to palpation.  Extremities:  Benign.  No peripheral edema or cyanosis.  Skin:  Benign.  Neuro:  Nonfocal.    Lab Results: Lab Results Component Value Date  WBC 7.0 07/17/2011  HGB 14.3 07/17/2011  HCT 42.5 07/17/2011  MCV 90.6 07/17/2011  PLT 182 07/17/2011    Chemistry    Component Value Date/Time  NA 143 07/17/2011 1356  K 3.9 07/17/2011 1356  CL 107 07/17/2011 1356  CO2 24 07/17/2011 1356  BUN 10 07/17/2011 1356  CREATININE 0.87 07/17/2011 1356     Component Value Date/Time  CALCIUM 9.0 07/17/2011 1356  ALKPHOS 93 07/17/2011 1356  AST 29 07/17/2011 1356  ALT 27 07/17/2011 1356  BILITOT 0.8 07/17/2011 1356     Radiological Studies:  No results found.   IMPRESSIONS AND PLAN: A 66 y.o. female with   History of node-negative ER/PR positive breast cancer status post lumpectomy radiation currently on Arimidex. She is doing well without evidence of local recurrence. I will see her in 6 months time.  Spent more than half the time coordinating care, as well as discussion of BMI and its implications.      Julie [name] 7/15/20131:43 PM Cell [phone]

## 2011-07-18 LAB — COMPREHENSIVE METABOLIC PANEL
Albumin: 4.2 g/dL (ref 3.5–5.2)
Alkaline Phosphatase: 93 U/L (ref 39–117)
BUN: 10 mg/dL (ref 6–23)
CO2: 24 mEq/L (ref 19–32)
Calcium: 9 mg/dL (ref 8.4–10.5)
Glucose, Bld: 100 mg/dL — ABNORMAL HIGH (ref 70–99)
Potassium: 3.9 mEq/L (ref 3.5–5.3)
Sodium: 143 mEq/L (ref 135–145)
Total Protein: 6.6 g/dL (ref 6.0–8.3)

## 2011-07-18 LAB — VITAMIN D 25 HYDROXY (VIT D DEFICIENCY, FRACTURES): Vit D, 25-Hydroxy: 78 ng/mL (ref 30–89)

## 2011-09-09 ENCOUNTER — Ambulatory Visit (INDEPENDENT_AMBULATORY_CARE_PROVIDER_SITE_OTHER): Payer: Medicare Other | Admitting: General Surgery

## 2011-09-09 ENCOUNTER — Encounter (INDEPENDENT_AMBULATORY_CARE_PROVIDER_SITE_OTHER): Payer: Self-pay | Admitting: General Surgery

## 2011-09-09 VITALS — BP 130/80 | HR 85 | Temp 97.7°F | Resp 18 | Ht 63.0 in | Wt 178.0 lb

## 2011-09-09 DIAGNOSIS — C50919 Malignant neoplasm of unspecified site of unspecified female breast: Secondary | ICD-10-CM

## 2011-09-09 DIAGNOSIS — C50911 Malignant neoplasm of unspecified site of right female breast: Secondary | ICD-10-CM

## 2011-09-09 NOTE — Progress Notes (Signed)
HISTORY: Patient is a 66 year old female who is now around 15 months postoperatively from the right breast conservation treatment. She had stage I breast cancer. She is doing well overall. She has not noticed any new breast masses. She had her followup mammogram in April which was negative for concerning lesions. She is taking her Arimedex without side effects.  The only thing that she has noticed that is different as issues with her hearing. She describes as being very shaky. She does not feel like her hands particularly weak and is able to squeeze a ball without difficulty. She denies arm swelling. She denies weakness in the rest of her arm. She has not limitation of mobility in her arm.   PERTINENT REVIEW OF SYSTEMS: Otherwise negative.     EXAM: Head: Normocephalic and atraumatic.  Eyes:  Conjunctivae are normal. Pupils are equal, round, and reactive to light. No scleral icterus.  Neck:  Normal range of motion. Neck supple. No tracheal deviation present. No thyromegaly present.  Resp: No respiratory distress, normal effort. Normal breath sounds.   Breast:  R breast slightly smaller post operatively than left breast.  Still slightly sore with thickening at scar.  No masses, no lymphadenopathy.  No nipple retraction or skin dimpling.   CV:  RR&R, no murmurs.   Abd:  Abdomen is soft, non distended and non tender. No masses are palpable.  There is no rebound and no guarding.  Neurological: Alert and oriented to person, place, and time. Coordination normal. R hand with good strength  Skin: Skin is warm and dry. No rash noted. No diaphoretic. No erythema. No pallor.  Psychiatric: Normal mood and affect. Normal behavior. Judgment and thought content normal.      ASSESSMENT AND PLAN:   R breast cancer, pT1bN0M0 ER/PR+, her 2neg, ki67 10%, s/p BCT 05/16/2010 No clinical evidence of recurrent disease.  Continue arimidex per Dr. Donnie Coffin.  Mammogram next year.     Follow up with handwriting  issue with PCP   Maudry Diego, MD Surgical Oncology, General & Endocrine Surgery [facility]  [place], [place] Carilyn [place]past[place], [place]

## 2011-09-09 NOTE — Patient Instructions (Signed)
Continue Arimidex  Continue mammograms.  Discuss handwriting issue with PCP  Follow up in 1 year.

## 2011-09-09 NOTE — Assessment & Plan Note (Signed)
No clinical evidence of recurrent disease.  Continue arimidex per Dr. Donnie Coffin.  Mammogram next year.

## 2011-11-17 ENCOUNTER — Other Ambulatory Visit: Payer: Self-pay | Admitting: *Deleted

## 2011-11-17 DIAGNOSIS — C50919 Malignant neoplasm of unspecified site of unspecified female breast: Secondary | ICD-10-CM

## 2011-11-17 MED ORDER — ANASTROZOLE 1 MG PO TABS: 1.0000 mg | ORAL_TABLET | Freq: Every day | ORAL | Status: DC

## 2011-12-25 ENCOUNTER — Ambulatory Visit: Payer: Medicare Other | Admitting: Radiation Oncology

## 2012-01-14 ENCOUNTER — Telehealth: Payer: Self-pay | Admitting: *Deleted

## 2012-01-14 NOTE — Telephone Encounter (Signed)
MS. Demary called to cancel her appt tomorrow ,she is going out of town, and when she gets back she will call and reschedule to see Dr.Wentworth, informed  Her I would let MD, and her nurse Percell Boston, and scheduler, Otis Peak know as well, 10:45 AM

## 2012-01-15 ENCOUNTER — Ambulatory Visit: Admission: RE | Admit: 2012-01-15 | Payer: Medicare Other | Source: Ambulatory Visit | Admitting: Radiation Oncology

## 2012-02-07 ENCOUNTER — Telehealth: Payer: Self-pay | Admitting: *Deleted

## 2012-02-07 ENCOUNTER — Encounter: Payer: Self-pay | Admitting: Oncology

## 2012-02-07 NOTE — Telephone Encounter (Signed)
Julie [name] called and confirmed 03/09/12 appt w/ pt.  Mailed letter & calendar to pt.

## 2012-03-09 ENCOUNTER — Ambulatory Visit: Payer: Medicare Other | Admitting: Adult Health

## 2012-03-18 ENCOUNTER — Other Ambulatory Visit: Payer: Medicare Other | Admitting: Lab

## 2012-03-18 ENCOUNTER — Ambulatory Visit: Payer: Medicare Other | Admitting: Oncology

## 2012-04-09 ENCOUNTER — Other Ambulatory Visit: Payer: Self-pay | Admitting: Medical Oncology

## 2012-04-09 DIAGNOSIS — C50911 Malignant neoplasm of unspecified site of right female breast: Secondary | ICD-10-CM

## 2012-04-12 ENCOUNTER — Encounter: Payer: Self-pay | Admitting: Adult Health

## 2012-04-12 ENCOUNTER — Other Ambulatory Visit (HOSPITAL_BASED_OUTPATIENT_CLINIC_OR_DEPARTMENT_OTHER): Payer: Medicare Other | Admitting: Lab

## 2012-04-12 ENCOUNTER — Telehealth: Payer: Self-pay | Admitting: Oncology

## 2012-04-12 ENCOUNTER — Ambulatory Visit (HOSPITAL_BASED_OUTPATIENT_CLINIC_OR_DEPARTMENT_OTHER): Payer: Medicare Other | Admitting: Adult Health

## 2012-04-12 VITALS — BP 150/85 | HR 97 | Temp 98.8°F | Resp 20 | Ht 63.0 in | Wt 174.6 lb

## 2012-04-12 DIAGNOSIS — C50419 Malignant neoplasm of upper-outer quadrant of unspecified female breast: Secondary | ICD-10-CM

## 2012-04-12 DIAGNOSIS — Z170 Estrogen receptor positive status [ER+]: Secondary | ICD-10-CM

## 2012-04-12 DIAGNOSIS — C50911 Malignant neoplasm of unspecified site of right female breast: Secondary | ICD-10-CM

## 2012-04-12 DIAGNOSIS — E559 Vitamin D deficiency, unspecified: Secondary | ICD-10-CM

## 2012-04-12 LAB — CBC WITH DIFFERENTIAL/PLATELET
Basophils Absolute: 0 10*3/uL (ref 0.0–0.1)
Eosinophils Absolute: 0.2 10*3/uL (ref 0.0–0.5)
HCT: 43.7 % (ref 34.8–46.6)
HGB: 14.7 g/dL (ref 11.6–15.9)
LYMPH%: 22.1 % (ref 14.0–49.7)
MONO#: 0.5 10*3/uL (ref 0.1–0.9)
NEUT%: 69.8 % (ref 38.4–76.8)
Platelets: 183 10*3/uL (ref 145–400)
WBC: 9.3 10*3/uL (ref 3.9–10.3)
lymph#: 2.1 10*3/uL (ref 0.9–3.3)

## 2012-04-12 LAB — COMPREHENSIVE METABOLIC PANEL (CC13)
ALT: 18 U/L (ref 0–55)
BUN: 16.5 mg/dL (ref 7.0–26.0)
CO2: 23 mEq/L (ref 22–29)
Calcium: 9.1 mg/dL (ref 8.4–10.4)
Chloride: 107 mEq/L (ref 98–107)
Creatinine: 0.9 mg/dL (ref 0.6–1.1)
Glucose: 134 mg/dl — ABNORMAL HIGH (ref 70–99)
Total Bilirubin: 0.97 mg/dL (ref 0.20–1.20)

## 2012-04-12 MED ORDER — ANASTROZOLE 1 MG PO TABS: 1.0000 mg | ORAL_TABLET | Freq: Every day | ORAL | Status: DC

## 2012-04-12 NOTE — Progress Notes (Signed)
OFFICE PROGRESS NOTE  CCCarilyn Goodpasture, PA-C [address] [place] [address]  DIAGNOSIS: 66 y/o with stage IA with ER positive, PR positive, HER-2/neu negative invasive ductal carcinoma of the right breast  PRIOR THERAPY:  1. Patient was initially diagnosed in April 2012 when she had a screening mammogram that showed a mass in the right breast.  Biopsy on 05/01/10 showed an invasive mammary carcinoma ER 100%, PR 60%, HER-2/neu negative, Ki-67 of 10%.  She had a MRI on 05/08/10 that showed a 6mm nodule in the upper outer quadrant of the right breast.   2. She went on to have a lumpectomy by Dr. Donell Beers on 05/16/10 that demonstrated invasive ductal carcinoma, grade 1, 0.8cm and DCIS.  2 sentinel nodes were biopsied and negative.  She had pathologic stage IA breast cancer.  3.  She underwent radiation therapy with Dr. Michell Heinrich from 06/12/10 to 07/04/10.  4. Around 08/2010 she started daily Arimidex.    CURRENT THERAPY: Arimidex daily   INTERVAL HISTORY: Julie [name] 67 y.o. female returns for follow up.  She is doing well today.  She takes the Arimidex daily and tolerates it very well.  She denies hot flashes, dryness, joint aches, and any further concerns.  Her health maintenance information is updated below.  A 10 point ROS is negative.    MEDICAL HISTORY: Past Medical History  Diagnosis Date  . Nipple discharge     right  . Colon polyp   . Cancer     breast rt    ALLERGIES:  has No Known Allergies.  MEDICATIONS:  Current Outpatient Prescriptions  Medication Sig Dispense Refill  . anastrozole (ARIMIDEX) 1 MG tablet Take 1 tablet (1 mg total) by mouth daily.  90 tablet  12  . atorvastatin (LIPITOR) 10 MG tablet Daily.      . Calcium Carbonate-Vitamin D (CALCIUM + D) 600-200 MG-UNIT TABS Take by mouth daily.        . Cholecalciferol (VITAMIN D-3 PO) Take 2,000 Units by mouth daily.        Marland Kitchen lisinopril (PRINIVIL,ZESTRIL) 10 MG tablet Daily.      Marland Kitchen  losartan (COZAAR) 25 MG tablet Take 25 mg by mouth daily.      . Multiple Vitamin (MULTIVITAMIN) capsule Take 1 capsule by mouth daily.         No current facility-administered medications for this visit.    SURGICAL HISTORY:  Past Surgical History  Procedure Laterality Date  . Cesarean section    . Breast surgery      lumpectomy  . Parathyroidectomy    . Appendectomy      REVIEW OF SYSTEMS:   General: fatigue (-), night sweats (-), fever (-), pain (-) Lymph: palpable nodes (-) HEENT: vision changes (-), mucositis (-), gum bleeding (-), epistaxis (-) Cardiovascular: chest pain (-), palpitations (-) Pulmonary: shortness of breath (-), dyspnea on exertion (-), cough (-), hemoptysis (-) GI:  Early satiety (-), melena (-), dysphagia (-), nausea/vomiting (-), diarrhea (-) GU: dysuria (-), hematuria (-), incontinence (-) Musculoskeletal: joint swelling (-), joint pain (-), back pain (-) Neuro: weakness (-), numbness (-), headache (-), confusion (-) Skin: Rash (-), lesions (-), dryness (-) Psych: depression (-), suicidal/homicidal ideation (-), feeling of hopelessness (-)   HEALTH MAINTENANCE:  Mammogram 04/2011 Colonoscopy 07/2010 Bone Density 04/2010 followed by PCP Pap Smear 2 years ago Eye Exam 1 year ago Vitamin D 78 on 07/2011  Lipid Panel 6 months ago  PHYSICAL EXAMINATION:  Blood pressure 150/85, pulse 97, temperature 98.8 F (37.1 C), temperature source Oral, resp. rate 20, height 5\' 3"  (1.6 m), weight 174 lb 9.6 oz (79.198 kg). Body mass index is 30.94 kg/(m^2). General: Patient is a well appearing female in no acute distress HEENT: PERRLA, sclerae anicteric no conjunctival pallor, MMM Neck: supple, no palpable adenopathy Lungs: clear to auscultation bilaterally, no wheezes, rhonchi, or rales Cardiovascular: regular rate rhythm, S1, S2, no murmurs, rubs or gallops Abdomen: Soft, non-tender, non-distended, normoactive bowel sounds, no HSM Extremities: warm and well  perfused, no clubbing, cyanosis, or edema Skin: No rashes or lesions Neuro: Non-focal Breasts: right breast lumpectomy site without nodularity, breast without masses or nodules, left breast no masses or nodularity ECOG PERFORMANCE STATUS: 1 - Symptomatic but completely ambulatory    LABORATORY DATA: Lab Results  Component Value Date   WBC 9.3 04/12/2012   HGB 14.7 04/12/2012   HCT 43.7 04/12/2012   MCV 90.4 04/12/2012   PLT 183 04/12/2012      Chemistry      Component Value Date/Time   NA 142 04/12/2012 1349   NA 143 07/17/2011 1356   K 3.8 04/12/2012 1349   K 3.9 07/17/2011 1356   CL 107 04/12/2012 1349   CL 107 07/17/2011 1356   CO2 23 04/12/2012 1349   CO2 24 07/17/2011 1356   BUN 16.5 04/12/2012 1349   BUN 10 07/17/2011 1356   CREATININE 0.9 04/12/2012 1349   CREATININE 0.87 07/17/2011 1356      Component Value Date/Time   CALCIUM 9.1 04/12/2012 1349   CALCIUM 9.0 07/17/2011 1356   ALKPHOS 110 04/12/2012 1349   ALKPHOS 93 07/17/2011 1356   AST 20 04/12/2012 1349   AST 29 07/17/2011 1356   ALT 18 04/12/2012 1349   ALT 27 07/17/2011 1356   BILITOT 0.97 04/12/2012 1349   BILITOT 0.8 07/17/2011 1356       RADIOGRAPHIC STUDIES:  No results found.  ASSESSMENT:    Patient is a 67 year old with pathologic stage IA ER positive, PR positive, HER-2/neu negative invasive ducal carcinoma of the right breast.  She underwent lumpectomy, radiation therapy and now is on daily Arimidex for almost two years.  We plan to complete 5 years of therapy.     PLAN:   Julie [name] is doing well today.  She will continue daily Arimidex.  No sign of recurrence.  We updated her health maintenance information.  She will continue to follow up as needed.  We discussed self breast exams, diet, exercise today.  She will continue to take daily Vitamin D.  Her mammogram is scheduled, and she will have her bone density soon as well.    All questions were answered. The patient knows to call the clinic with any problems, questions or  concerns. We can certainly see the patient much sooner if necessary.  I spent 40 minutes counseling the patient face to face. The total time spent in the appointment was 60 minutes.  This case was reviewed with Dr. Welton Flakes.   Cherie Ouch Lyn Hollingshead, NP Medical Oncology [facility] Phone: [phone] 04/14/2012, 8:49 AM

## 2012-04-12 NOTE — Patient Instructions (Addendum)
Doing well.  No sign of recurrence.  Continue daily Arimidex.  We will see you back in 6 months.  Please call us if you have any questions or concerns.

## 2012-04-12 NOTE — Telephone Encounter (Signed)
gv pt appt schedule for October.  °

## 2012-07-09 ENCOUNTER — Encounter (HOSPITAL_COMMUNITY): Payer: Self-pay | Admitting: Certified Registered Nurse Anesthetist

## 2012-07-09 ENCOUNTER — Inpatient Hospital Stay: Admit: 2012-07-09 | Payer: Self-pay | Admitting: Urology

## 2012-07-09 ENCOUNTER — Emergency Department (HOSPITAL_COMMUNITY): Payer: Medicare Other | Admitting: Certified Registered Nurse Anesthetist

## 2012-07-09 ENCOUNTER — Inpatient Hospital Stay (HOSPITAL_COMMUNITY)
Admission: EM | Admit: 2012-07-09 | Discharge: 2012-07-11 | DRG: 693 | Disposition: A | Discharge status: Home / Self Care | Payer: Medicare Other | Attending: Urology | Admitting: Urology

## 2012-07-09 ENCOUNTER — Encounter (HOSPITAL_COMMUNITY)
Admission: EM | Disposition: A | Discharge status: Home / Self Care | Payer: Self-pay | Source: Home / Self Care | Attending: Urology

## 2012-07-09 ENCOUNTER — Emergency Department (HOSPITAL_COMMUNITY): Payer: Medicare Other

## 2012-07-09 ENCOUNTER — Encounter (HOSPITAL_COMMUNITY): Payer: Self-pay | Admitting: Emergency Medicine

## 2012-07-09 DIAGNOSIS — Z87891 Personal history of nicotine dependence: Secondary | ICD-10-CM

## 2012-07-09 DIAGNOSIS — A419 Sepsis, unspecified organism: Secondary | ICD-10-CM | POA: Diagnosis present

## 2012-07-09 DIAGNOSIS — N132 Hydronephrosis with renal and ureteral calculous obstruction: Secondary | ICD-10-CM

## 2012-07-09 DIAGNOSIS — R000 Tachycardia, unspecified: Secondary | ICD-10-CM

## 2012-07-09 DIAGNOSIS — Z853 Personal history of malignant neoplasm of breast: Secondary | ICD-10-CM

## 2012-07-09 DIAGNOSIS — R319 Hematuria, unspecified: Secondary | ICD-10-CM

## 2012-07-09 DIAGNOSIS — I959 Hypotension, unspecified: Secondary | ICD-10-CM

## 2012-07-09 DIAGNOSIS — N201 Calculus of ureter: Principal | ICD-10-CM | POA: Diagnosis present

## 2012-07-09 DIAGNOSIS — N12 Tubulo-interstitial nephritis, not specified as acute or chronic: Secondary | ICD-10-CM

## 2012-07-09 DIAGNOSIS — Q619 Cystic kidney disease, unspecified: Secondary | ICD-10-CM

## 2012-07-09 DIAGNOSIS — E785 Hyperlipidemia, unspecified: Secondary | ICD-10-CM | POA: Diagnosis present

## 2012-07-09 DIAGNOSIS — N3090 Cystitis, unspecified without hematuria: Secondary | ICD-10-CM | POA: Diagnosis present

## 2012-07-09 DIAGNOSIS — N1330 Unspecified hydronephrosis: Secondary | ICD-10-CM | POA: Diagnosis present

## 2012-07-09 DIAGNOSIS — Z87442 Personal history of urinary calculi: Secondary | ICD-10-CM

## 2012-07-09 DIAGNOSIS — I10 Essential (primary) hypertension: Secondary | ICD-10-CM | POA: Diagnosis present

## 2012-07-09 HISTORY — PX: CYSTOSCOPY WITH STENT PLACEMENT: SHX5790

## 2012-07-09 LAB — BASIC METABOLIC PANEL
BUN: 23 mg/dL (ref 6–23)
Chloride: 106 mEq/L (ref 96–112)
GFR calc Af Amer: 52 mL/min — ABNORMAL LOW (ref >90)
Potassium: 3.9 mEq/L (ref 3.5–5.1)

## 2012-07-09 LAB — CBC WITH DIFFERENTIAL/PLATELET
Basophils Absolute: 0 10*3/uL (ref 0.0–0.1)
Basophils Relative: 0 % (ref 0–1)
Hemoglobin: 14.9 g/dL (ref 12.0–15.0)
MCHC: 34.7 g/dL (ref 30.0–36.0)
Monocytes Relative: 1 % — ABNORMAL LOW (ref 3–12)
Neutro Abs: 7.5 10*3/uL (ref 1.7–7.7)
Neutrophils Relative %: 94 % — ABNORMAL HIGH (ref 43–77)

## 2012-07-09 LAB — URINALYSIS, ROUTINE W REFLEX MICROSCOPIC
Ketones, ur: 15 mg/dL — AB
Nitrite: POSITIVE — AB
Specific Gravity, Urine: 1.022 (ref 1.005–1.030)
pH: 5.5 (ref 5.0–8.0)

## 2012-07-09 LAB — URINE MICROSCOPIC-ADD ON

## 2012-07-09 LAB — CG4 I-STAT (LACTIC ACID): Lactic Acid, Venous: 3.2 mmol/L — ABNORMAL HIGH (ref 0.5–2.2)

## 2012-07-09 SURGERY — CYSTOSCOPY, WITH STENT INSERTION
Anesthesia: General | Site: Ureter | Laterality: Left | Wound class: Clean

## 2012-07-09 MED ORDER — DEXTROSE 5 % IV SOLN
1.0000 g | Freq: Once | INTRAVENOUS | Status: AC
  Administered 2012-07-09: 1 g via INTRAVENOUS
  Filled 2012-07-09: qty 10

## 2012-07-09 MED ORDER — GENTAMICIN SULFATE 40 MG/ML IJ SOLN
400.0000 mg | Freq: Once | INTRAVENOUS | Status: AC
  Administered 2012-07-09: 400 mg via INTRAVENOUS
  Filled 2012-07-09: qty 10

## 2012-07-09 MED ORDER — PROPOFOL 10 MG/ML IV BOLUS
INTRAVENOUS | Status: DC | PRN
  Administered 2012-07-09: 150 mg via INTRAVENOUS

## 2012-07-09 MED ORDER — SCOPOLAMINE 1 MG/3DAYS TD PT72
MEDICATED_PATCH | TRANSDERMAL | Status: DC | PRN
  Administered 2012-07-09: 1 via TRANSDERMAL

## 2012-07-09 MED ORDER — SODIUM CHLORIDE 0.9 % IR SOLN
Status: DC | PRN
  Administered 2012-07-09: 3000 mL

## 2012-07-09 MED ORDER — PHENYLEPHRINE HCL 10 MG/ML IJ SOLN
INTRAMUSCULAR | Status: DC | PRN
  Administered 2012-07-09 (×3): 80 ug via INTRAVENOUS

## 2012-07-09 MED ORDER — SCOPOLAMINE 1 MG/3DAYS TD PT72
MEDICATED_PATCH | TRANSDERMAL | Status: AC
  Filled 2012-07-09: qty 1

## 2012-07-09 MED ORDER — LIDOCAINE HCL (CARDIAC) 20 MG/ML IV SOLN
INTRAVENOUS | Status: DC | PRN
  Administered 2012-07-09: 100 mg via INTRAVENOUS

## 2012-07-09 MED ORDER — SODIUM CHLORIDE 0.9 % IV BOLUS (SEPSIS)
1000.0000 mL | Freq: Once | INTRAVENOUS | Status: AC
  Administered 2012-07-09: 1000 mL via INTRAVENOUS

## 2012-07-09 MED ORDER — OXYCODONE HCL 5 MG/5ML PO SOLN
5.0000 mg | Freq: Once | ORAL | Status: DC | PRN
  Filled 2012-07-09: qty 5

## 2012-07-09 MED ORDER — KCL IN DEXTROSE-NACL 20-5-0.45 MEQ/L-%-% IV SOLN
INTRAVENOUS | Status: DC
  Administered 2012-07-09: 125 mL/h via INTRAVENOUS
  Administered 2012-07-09: 18:00:00 via INTRAVENOUS
  Administered 2012-07-10: 1000 mL via INTRAVENOUS
  Administered 2012-07-11: 05:00:00 via INTRAVENOUS
  Filled 2012-07-09 (×5): qty 1000

## 2012-07-09 MED ORDER — OXYCODONE HCL 5 MG PO TABS: 5.0000 mg | ORAL_TABLET | Freq: Once | ORAL | Status: DC | PRN

## 2012-07-09 MED ORDER — PIPERACILLIN-TAZOBACTAM 3.375 G IVPB
3.3750 g | Freq: Three times a day (TID) | INTRAVENOUS | Status: DC
  Administered 2012-07-09 – 2012-07-11 (×6): 3.375 g via INTRAVENOUS
  Filled 2012-07-09 (×8): qty 50

## 2012-07-09 MED ORDER — METOCLOPRAMIDE HCL 5 MG/ML IJ SOLN
INTRAMUSCULAR | Status: DC | PRN
  Administered 2012-07-09: 10 mg via INTRAVENOUS

## 2012-07-09 MED ORDER — DEXAMETHASONE SODIUM PHOSPHATE 10 MG/ML IJ SOLN
INTRAMUSCULAR | Status: DC | PRN
  Administered 2012-07-09: 10 mg via INTRAVENOUS

## 2012-07-09 MED ORDER — SENNA 8.6 MG PO TABS
1.0000 | ORAL_TABLET | Freq: Two times a day (BID) | ORAL | Status: DC
  Administered 2012-07-09 – 2012-07-11 (×4): 8.6 mg via ORAL
  Filled 2012-07-09 (×4): qty 1

## 2012-07-09 MED ORDER — HEPARIN SODIUM (PORCINE) 5000 UNIT/ML IJ SOLN
5000.0000 [IU] | Freq: Three times a day (TID) | INTRAMUSCULAR | Status: DC
  Administered 2012-07-09 – 2012-07-11 (×5): 5000 [IU] via SUBCUTANEOUS
  Filled 2012-07-09 (×8): qty 1

## 2012-07-09 MED ORDER — HYDROMORPHONE HCL PF 1 MG/ML IJ SOLN
0.5000 mg | Freq: Once | INTRAMUSCULAR | Status: AC
  Administered 2012-07-09: 0.5 mg via INTRAVENOUS
  Filled 2012-07-09: qty 1

## 2012-07-09 MED ORDER — IOHEXOL 300 MG/ML  SOLN
INTRAMUSCULAR | Status: AC
  Filled 2012-07-09: qty 1

## 2012-07-09 MED ORDER — FENTANYL CITRATE 0.05 MG/ML IJ SOLN
INTRAMUSCULAR | Status: DC | PRN
  Administered 2012-07-09: 50 ug via INTRAVENOUS

## 2012-07-09 MED ORDER — ONDANSETRON HCL 4 MG/2ML IJ SOLN
INTRAMUSCULAR | Status: DC | PRN
  Administered 2012-07-09: 4 mg via INTRAVENOUS

## 2012-07-09 MED ORDER — ATORVASTATIN CALCIUM 10 MG PO TABS
10.0000 mg | ORAL_TABLET | Freq: Every day | ORAL | Status: DC
  Administered 2012-07-10 – 2012-07-11 (×2): 10 mg via ORAL
  Filled 2012-07-09 (×3): qty 1

## 2012-07-09 MED ORDER — HYDROMORPHONE HCL PF 1 MG/ML IJ SOLN: 0.5000 mg | INTRAMUSCULAR | Status: DC | PRN

## 2012-07-09 MED ORDER — EPHEDRINE SULFATE 50 MG/ML IJ SOLN
INTRAMUSCULAR | Status: DC | PRN
  Administered 2012-07-09 (×3): 10 mg via INTRAVENOUS

## 2012-07-09 MED ORDER — SODIUM CHLORIDE 0.9 % IV SOLN
INTRAVENOUS | Status: DC
  Administered 2012-07-09: 14:00:00 via INTRAVENOUS

## 2012-07-09 MED ORDER — ONDANSETRON HCL 4 MG/2ML IJ SOLN
4.0000 mg | Freq: Once | INTRAMUSCULAR | Status: AC
  Administered 2012-07-09: 4 mg via INTRAVENOUS
  Filled 2012-07-09: qty 2

## 2012-07-09 MED ORDER — ANASTROZOLE 1 MG PO TABS
1.0000 mg | ORAL_TABLET | Freq: Every day | ORAL | Status: DC
  Administered 2012-07-10 – 2012-07-11 (×2): 1 mg via ORAL
  Filled 2012-07-09 (×3): qty 1

## 2012-07-09 MED ORDER — GENTAMICIN IN SALINE 1.6-0.9 MG/ML-% IV SOLN: 80.0000 mg | Freq: Once | INTRAVENOUS | Status: DC

## 2012-07-09 MED ORDER — IOHEXOL 300 MG/ML  SOLN
INTRAMUSCULAR | Status: DC | PRN
  Administered 2012-07-09: 10 mL

## 2012-07-09 MED ORDER — HYDROMORPHONE HCL PF 1 MG/ML IJ SOLN: 0.2500 mg | INTRAMUSCULAR | Status: DC | PRN

## 2012-07-09 MED ORDER — ACETAMINOPHEN 10 MG/ML IV SOLN
1000.0000 mg | Freq: Once | INTRAVENOUS | Status: DC | PRN
  Filled 2012-07-09: qty 100

## 2012-07-09 MED ORDER — OXYCODONE-ACETAMINOPHEN 5-325 MG PO TABS
1.0000 | ORAL_TABLET | ORAL | Status: DC | PRN
  Administered 2012-07-10: 1 via ORAL
  Filled 2012-07-09: qty 1

## 2012-07-09 MED ORDER — ONDANSETRON HCL 4 MG/2ML IJ SOLN
4.0000 mg | INTRAMUSCULAR | Status: DC | PRN
Start: 2012-07-09 — End: 2012-07-11

## 2012-07-09 MED ORDER — MIDAZOLAM HCL 5 MG/5ML IJ SOLN
INTRAMUSCULAR | Status: DC | PRN
  Administered 2012-07-09: 2 mg via INTRAVENOUS

## 2012-07-09 MED ORDER — MEPERIDINE HCL 50 MG/ML IJ SOLN: 6.2500 mg | INTRAMUSCULAR | Status: DC | PRN

## 2012-07-09 MED ORDER — KCL IN DEXTROSE-NACL 20-5-0.45 MEQ/L-%-% IV SOLN
INTRAVENOUS | Status: AC
  Filled 2012-07-09: qty 1000

## 2012-07-09 MED ORDER — PROMETHAZINE HCL 25 MG/ML IJ SOLN: 6.2500 mg | INTRAMUSCULAR | Status: DC | PRN

## 2012-07-09 MED ORDER — DOCUSATE SODIUM 100 MG PO CAPS
100.0000 mg | ORAL_CAPSULE | Freq: Two times a day (BID) | ORAL | Status: DC
  Administered 2012-07-09 – 2012-07-11 (×4): 100 mg via ORAL
  Filled 2012-07-09 (×5): qty 1

## 2012-07-09 SURGICAL SUPPLY — 20 items
ADAPTER CATH URET PLST 4-6FR (CATHETERS) ×2 IMPLANT
ADPR CATH URET STRL DISP 4-6FR (CATHETERS) ×1
BAG URINE DRAINAGE (UROLOGICAL SUPPLIES) ×2 IMPLANT
BAG URO CATCHER STRL LF (DRAPE) ×2 IMPLANT
BASKET ZERO TIP NITINOL 2.4FR (BASKET) IMPLANT
CATH FOLEY 2WAY SLVR  5CC 16FR (CATHETERS) ×1
CATH FOLEY 2WAY SLVR 5CC 16FR (CATHETERS) ×1 IMPLANT
CATH INTERMIT  6FR 70CM (CATHETERS) ×2 IMPLANT
CLOTH BEACON ORANGE TIMEOUT ST (SAFETY) ×2 IMPLANT
DRAPE CAMERA CLOSED 9X96 (DRAPES) ×2 IMPLANT
GLOVE BIOGEL M STRL SZ7.5 (GLOVE) ×2 IMPLANT
GOWN STRL NON-REIN LRG LVL3 (GOWN DISPOSABLE) ×2 IMPLANT
GOWN STRL REIN XL XLG (GOWN DISPOSABLE) ×2 IMPLANT
GUIDEWIRE ANG ZIPWIRE 038X150 (WIRE) ×2 IMPLANT
GUIDEWIRE STR DUAL SENSOR (WIRE) ×2 IMPLANT
HOLDER FOLEY CATH W/STRAP (MISCELLANEOUS) ×2 IMPLANT
MANIFOLD NEPTUNE II (INSTRUMENTS) ×2 IMPLANT
PACK CYSTO (CUSTOM PROCEDURE TRAY) ×2 IMPLANT
STENT CONTOUR 6FRX24X.038 (STENTS) ×2 IMPLANT
TUBING CONNECTING 10 (TUBING) ×2 IMPLANT

## 2012-07-09 NOTE — ED Notes (Signed)
Pt. Stated, i started having to use the BR frquently a couple of weeks ago, and last night I started having pain when i urinate with blood.

## 2012-07-09 NOTE — ED Provider Notes (Signed)
History    CSN: [id number] Arrival date & time 07/09/12  1119  First MD Initiated Contact with Patient 07/09/12 1149     Chief Complaint  Patient presents with  . Hematuria   (Consider location/radiation/quality/duration/timing/severity/associated sxs/prior Treatment) Patient is a 67 y.o. female presenting with hematuria. The history is provided by the patient and the spouse.  Hematuria Associated symptoms include abdominal pain. Pertinent negatives include no chest pain and no shortness of breath.   the patient with discomfort with urination starting a couple weeks ago. Last May patient started having significant pain with urination and started to have red blood. 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 Ruthann Cancer when in the past. Patient was at a [place] parade today started feeling much worse lightheaded and nauseated. No syncope. No fevers but does have chills. Past Medical History  Diagnosis Date  . Nipple discharge     right  . Colon polyp   . Cancer     breast rt   Past Surgical History  Procedure Laterality Date  . Cesarean section    . Breast surgery      lumpectomy  . Parathyroidectomy    . Appendectomy     Family History  Problem Relation Age of Onset  . Hypertension Mother   . Hyperlipidemia Mother   . Hypertension Father   . Hyperlipidemia Father    History  Substance Use Topics  . Smoking status: Former Games developer  . Smokeless tobacco: Not on file  . Alcohol Use: No   OB History   Grav Para Term Preterm Abortions TAB SAB Ect Mult Living                 Review of Systems  Constitutional: Positive for chills and fatigue. Negative for fever.  HENT: Negative for congestion.   Eyes: Negative for redness.  Respiratory: Negative for shortness of breath.   Cardiovascular: Negative for chest pain.  Gastrointestinal: Positive for nausea and abdominal pain. Negative for vomiting.   Genitourinary: Positive for dysuria and hematuria.  Musculoskeletal: Positive for back pain.  Neurological: Positive for dizziness and light-headedness.    Allergies  Review of patient's allergies indicates no known allergies.  Home Medications   Current Outpatient Rx  Name  Route  Sig  Dispense  Refill  . anastrozole (ARIMIDEX) 1 MG tablet   Oral   Take 1 mg by mouth daily.         Marland Kitchen atorvastatin (LIPITOR) 10 MG tablet   Oral   Take 10 mg by mouth Daily.          . Calcium Carbonate-Vitamin D (CALCIUM + D) 600-200 MG-UNIT TABS   Oral   Take 1 tablet by mouth daily.          . Cholecalciferol (VITAMIN D-3 PO)   Oral   Take 2,000 Units by mouth daily.          Marland Kitchen lisinopril (PRINIVIL,ZESTRIL) 10 MG tablet   Oral   Take 10 mg by mouth Daily.           BP 97/59  Pulse 115  Temp(Src) 98.5 F (36.9 C) (Oral)  Resp 20  SpO2 95% Physical Exam  Nursing note and vitals reviewed. Constitutional: She is oriented to person, place, and time. She appears well-developed and well-nourished. She appears distressed.  HENT:  Head: Normocephalic and atraumatic.  Mouth/Throat: Oropharynx is clear and moist.  Eyes:  Conjunctivae are normal. Pupils are equal, round, and reactive to light.  Neck: Normal range of motion. Neck supple.  Cardiovascular: Normal rate, regular rhythm and normal heart sounds.   No murmur heard. Pulmonary/Chest: Effort normal and breath sounds normal. No respiratory distress.  Abdominal: Soft. Bowel sounds are normal. There is no tenderness.  Musculoskeletal: Normal range of motion. She exhibits no edema.  Neurological: She is alert and oriented to person, place, and time. No cranial nerve deficit. She exhibits normal muscle tone. Coordination normal.  Skin: Skin is warm. No rash noted.    ED Course  Procedures (including critical care time) Labs Reviewed  URINALYSIS, ROUTINE W REFLEX MICROSCOPIC - Abnormal; Notable for the following:    Color,  Urine RED (*)    APPearance TURBID (*)    Hgb urine dipstick LARGE (*)    Bilirubin Urine LARGE (*)    Ketones, ur 15 (*)    Protein, ur >300 (*)    Nitrite POSITIVE (*)    Leukocytes, UA LARGE (*)    All other components within normal limits  CBC WITH DIFFERENTIAL - Abnormal; Notable for the following:    Platelets 147 (*)    Neutrophils Relative % 94 (*)    Lymphocytes Relative 5 (*)    Lymphs Abs 0.4 (*)    Monocytes Relative 1 (*)    Monocytes Absolute 0.0 (*)    All other components within normal limits  BASIC METABOLIC PANEL - Abnormal; Notable for the following:    Creatinine, Ser 1.21 (*)    GFR calc non Af Amer 45 (*)    GFR calc Af Amer 52 (*)    All other components within normal limits  URINE MICROSCOPIC-ADD ON - Abnormal; Notable for the following:    Bacteria, UA MANY (*)    All other components within normal limits  URINE CULTURE   Ct Abdomen Pelvis Wo Contrast  07/09/2012   *RADIOLOGY REPORT*  Clinical Data: Left side abdominal pain, hematuria and chronic UTI  CT ABDOMEN AND PELVIS WITHOUT CONTRAST  Technique:  Multidetector CT imaging of the abdomen and pelvis was performed following the standard protocol without intravenous contrast.  Comparison: None.  Findings:  Lower Chest:  Dependent atelectasis in the lower lobes.  Visualized heart within normal limits for size.  There are coarse calcifications in the coronary arteries.  No pericardial effusion. Calcifications of the aortic valve and mitral annulus. Unremarkable distal thoracic esophagus.  Abdomen: Unenhanced CT was performed per clinician order.  Lack of IV contrast limits sensitivity and specificity, especially for evaluation of abdominal/pelvic solid viscera.  Within these limitations, unremarkable CT appearance of the stomach, duodenum, spleen, adrenal glands and pancreas.  Nonspecific low attenuation soft tissue within the lesser sac interposed between the superior greater curvature of the stomach and the  pancreatic body.  There is some coarse calcifications associated with the edge of this structure. Several small low attenuation lesions are identified scattered throughout the hepatic parenchyma which are incompletely characterized in the absence of intravenous contrast material.  The largest measures 12 mm the anterior aspect of hepatic segment two. Additionally, there are several coarse calcifications within the hepatic parenchyma, likely the sequelae of prior granulomatous disease. Gallbladder is unremarkable. No intra or extrahepatic biliary ductal dilatation.  Moderate left hydronephrosis with high attenuation material in the collecting system consistent with hematuria/thrombus.  There is an obstructing 6.5 by 10 mm stone in the proximal ureter.  Several small parapelvic cysts are noted in the upper  pole.  No definite mass lesion or renal contour abnormality.  There is nonspecific perinephric stranding.  There are several punctate nonobstructing stones on the right.  Numerous parapelvic cysts noted on the right. A single 12 mm low attenuation lesion exophytic from the lateral upper pole which is incompletely characterized in the absence of intravenous contrast material.  Normal-caliber large and small bowel.  No evidence of obstruction. No significant diverticular disease.  Normal appendix in the right lower quadrant.  No free fluid.  Pelvis: The bladder is decompressed.  Unremarkable adnexa and uterus.  No free fluid or suspicious adenopathy.  Bones: No acute fracture or aggressive appearing lytic or blastic osseous lesion.  Multilevel degenerative disc disease and lower lumbar facet arthropathy.  Vascular: Atherosclerotic vascular disease without aneurysmal dilatation.  IMPRESSION:  1.  Obstructing 6.5 x 10 mm proximal left ureteral stone with associated moderate left hydronephrosis and hemorrhage within the proximal dilated collecting system.  Degree of hemorrhage is somewhat more than expected for the  presence of the stone.  An underlying urothelial lesion is difficult to exclude.  Recommend urological consultation.  2.  Low attenuation somewhat amorphous mass in the lesser sac posterior to the pancreas.  The primary differential consideration is a lymphangioma. Recommend further evaluation with non emergent (outpatient) MRI of the abdomen with and without contrast to exclude other less likely neoplastic etiologies.  3.  Atherosclerosis including coronary artery disease  4.  Calcification of the aortic valve and mitral valve annulus  5. Bilateral parapelvic renal cysts.  6.  Low attenuation lesions in the liver and right kidney are too small to characterize but statistically likely cysts.  The cystic nature of these structures could also be confirmed on the follow-up MRI recommended above.   Original Report Authenticated By: Malachy Moan, M.D.   Results for orders placed during the hospital encounter of 07/09/12  URINALYSIS, ROUTINE W REFLEX MICROSCOPIC      Result Value Range   Color, Urine RED (*) YELLOW   APPearance TURBID (*) CLEAR   Specific Gravity, Urine 1.022  1.005 - 1.030   pH 5.5  5.0 - 8.0   Glucose, UA NEGATIVE  NEGATIVE mg/dL   Hgb urine dipstick LARGE (*) NEGATIVE   Bilirubin Urine LARGE (*) NEGATIVE   Ketones, ur 15 (*) NEGATIVE mg/dL   Protein, ur >811 (*) NEGATIVE mg/dL   Urobilinogen, UA 1.0  0.0 - 1.0 mg/dL   Nitrite POSITIVE (*) NEGATIVE   Leukocytes, UA LARGE (*) NEGATIVE  CBC WITH DIFFERENTIAL      Result Value Range   WBC 8.0  4.0 - 10.5 K/uL   RBC 4.78  3.87 - 5.11 MIL/uL   Hemoglobin 14.9  12.0 - 15.0 g/dL   HCT 91.4  78.2 - 95.6 %   MCV 90.0  78.0 - 100.0 fL   MCH 31.2  26.0 - 34.0 pg   MCHC 34.7  30.0 - 36.0 g/dL   RDW 21.3  08.6 - 57.8 %   Platelets 147 (*) 150 - 400 K/uL   Neutrophils Relative % 94 (*) 43 - 77 %   Neutro Abs 7.5  1.7 - 7.7 K/uL   Lymphocytes Relative 5 (*) 12 - 46 %   Lymphs Abs 0.4 (*) 0.7 - 4.0 K/uL   Monocytes Relative 1 (*) 3 -  12 %   Monocytes Absolute 0.0 (*) 0.1 - 1.0 K/uL   Eosinophils Relative 1  0 - 5 %   Eosinophils Absolute 0.0  0.0 -  0.7 K/uL   Basophils Relative 0  0 - 1 %   Basophils Absolute 0.0  0.0 - 0.1 K/uL  BASIC METABOLIC PANEL      Result Value Range   Sodium 141  135 - 145 mEq/L   Potassium 3.9  3.5 - 5.1 mEq/L   Chloride 106  96 - 112 mEq/L   CO2 20  19 - 32 mEq/L   Glucose, Bld 92  70 - 99 mg/dL   BUN 23  6 - 23 mg/dL   Creatinine, Ser 4.54 (*) 0.50 - 1.10 mg/dL   Calcium 9.7  8.4 - 09.8 mg/dL   GFR calc non Af Amer 45 (*) >90 mL/min   GFR calc Af Amer 52 (*) >90 mL/min  URINE MICROSCOPIC-ADD ON      Result Value Range   WBC, UA TOO NUMEROUS TO COUNT  <3 WBC/hpf   RBC / HPF TOO NUMEROUS TO COUNT  <3 RBC/hpf   Bacteria, UA MANY (*) RARE   Urine-Other URINALYSIS PERFORMED ON SUPERNATANT        1. Hematuria   2. Tachycardia   3. Hypotensive episode   4. Pyelonephritis   5. Ureteral stone with hydronephrosis    CRITICAL CARE Performed by: Shelda Jakes. Total critical care time: 30 Critical care time was exclusive of separately billable procedures and treating other patients. Critical care was necessary to treat or prevent imminent or life-threatening deterioration. Critical care was time spent personally by me on the following activities: development of treatment plan with patient and/or surrogate as well as nursing, discussions with consultants, evaluation of patient's response to treatment, examination of patient, obtaining history from patient or surrogate, ordering and performing treatments and interventions, ordering and review of laboratory studies, ordering and review of radiographic studies, pulse oximetry and re-evaluation of patient's condition.   MDM  Patient initially was hypotensive on first set of vital signs and tachycardic with blood pressure 69/36. Heart rate was 123. Afebrile. Oxygen saturation is in the low 90s. Patient has remained that way except for the  blood pressure has been in the 90s since getting back to the room and even prior to having fluids. Patient has received 1 L fluid heart rate has come down to 106. CT scan shows a very large stone in the left kidney with hydronephrosis. Urinalysis consistent with pyelonephritis/urinary tract infection. Patient received fluids patient received 1 g of Rocephin. Patient mentations always been good. Renal function shows a elevation in the creatinine to 1.21. Urology once the patient transferred to [facility] long for which stent placement in the operating room today. Discussed with Dr. Arnoldo Morale. He is aware of her vital signs. Overall patient is more stabilized and when she first arrived still mentating fine. There is a concern for the hypotensive episode but overall she's improved.  Shelda Jakes, MD 07/09/12 [phone]

## 2012-07-09 NOTE — Consult Note (Signed)
Reason for Consult:Left Ureteral Larina Bras, Urosepsis  Referring Physician: Deretha Emory MD  KIMRA KANTOR is an 67 y.o. female.   HPI:   1 - Left Ureteral Stone, Urosepsis - Pt with left proximal ureteral stone (9mm fusiform) with moderate hydro and likely blood products in left renal pelvis and pyuria + tachycardia 120's and hypotention SBP 90's in ER by CT on w/u for flank pain, malaise, hematuria. One prior stone episode.  2 - Rt > Lt Renal Cysts - pt wtih Rt approx 2cm upper pole cystic area not completely characterized on CT stone study from ER 7/4.  PMH sig for breast cancer (lumpectony + XRT, now NED), HTN, HLD. No CV disease. No strong blood thinners.  Today Layken is seen as emergent consult for above. Deneis fevers, admits to chills and malaise. Last meal 8 hours ago. She has UCX pending ans has received rocephin. Cr 1.2. WBC 8k.   Past Medical History  Diagnosis Date  . Nipple discharge     right  . Colon polyp   . Cancer     breast rt    Past Surgical History  Procedure Laterality Date  . Cesarean section    . Breast surgery      lumpectomy  . Parathyroidectomy    . Appendectomy      Family History  Problem Relation Age of Onset  . Hypertension Mother   . Hyperlipidemia Mother   . Hypertension Father   . Hyperlipidemia Father     Social History:  reports that she has quit smoking. She does not have any smokeless tobacco history on file. She reports that she does not drink alcohol or use illicit drugs.  Allergies: No Known Allergies  Medications: I have reviewed the patient's current medications.  Results for orders placed during the hospital encounter of 07/09/12 (from the past 48 hour(s))  CBC WITH DIFFERENTIAL     Status: Abnormal   Collection Time    07/09/12 12:04 PM      Result Value Range   WBC 8.0  4.0 - 10.5 K/uL   RBC 4.78  3.87 - 5.11 MIL/uL   Hemoglobin 14.9  12.0 - 15.0 g/dL   HCT 40.9  81.1 - 91.4 %   MCV 90.0  78.0 - 100.0 fL   MCH 31.2   26.0 - 34.0 pg   MCHC 34.7  30.0 - 36.0 g/dL   RDW 78.2  95.6 - 21.3 %   Platelets 147 (*) 150 - 400 K/uL   Neutrophils Relative % 94 (*) 43 - 77 %   Neutro Abs 7.5  1.7 - 7.7 K/uL   Lymphocytes Relative 5 (*) 12 - 46 %   Lymphs Abs 0.4 (*) 0.7 - 4.0 K/uL   Monocytes Relative 1 (*) 3 - 12 %   Monocytes Absolute 0.0 (*) 0.1 - 1.0 K/uL   Eosinophils Relative 1  0 - 5 %   Eosinophils Absolute 0.0  0.0 - 0.7 K/uL   Basophils Relative 0  0 - 1 %   Basophils Absolute 0.0  0.0 - 0.1 K/uL  BASIC METABOLIC PANEL     Status: Abnormal   Collection Time    07/09/12 12:04 PM      Result Value Range   Sodium 141  135 - 145 mEq/L   Potassium 3.9  3.5 - 5.1 mEq/L   Chloride 106  96 - 112 mEq/L   CO2 20  19 - 32 mEq/L   Glucose, Bld 92  70 - 99 mg/dL   BUN 23  6 - 23 mg/dL   Creatinine, Ser 1.61 (*) 0.50 - 1.10 mg/dL   Calcium 9.7  8.4 - 09.6 mg/dL   GFR calc non Af Amer 45 (*) >90 mL/min   GFR calc Af Amer 52 (*) >90 mL/min   Comment:            The eGFR has been calculated     using the CKD EPI equation.     This calculation has not been     validated in all clinical     situations.     eGFR's persistently     <90 mL/min signify     possible Chronic Kidney Disease.  URINALYSIS, ROUTINE W REFLEX MICROSCOPIC     Status: Abnormal   Collection Time    07/09/12 12:24 PM      Result Value Range   Color, Urine RED (*) YELLOW   Comment: BIOCHEMICALS MAY BE AFFECTED BY COLOR   APPearance TURBID (*) CLEAR   Specific Gravity, Urine 1.022  1.005 - 1.030   pH 5.5  5.0 - 8.0   Glucose, UA NEGATIVE  NEGATIVE mg/dL   Hgb urine dipstick LARGE (*) NEGATIVE   Bilirubin Urine LARGE (*) NEGATIVE   Ketones, ur 15 (*) NEGATIVE mg/dL   Protein, ur >045 (*) NEGATIVE mg/dL   Urobilinogen, UA 1.0  0.0 - 1.0 mg/dL   Nitrite POSITIVE (*) NEGATIVE   Leukocytes, UA LARGE (*) NEGATIVE  URINE MICROSCOPIC-ADD ON     Status: Abnormal   Collection Time    07/09/12 12:24 PM      Result Value Range   WBC, UA  TOO NUMEROUS TO COUNT  <3 WBC/hpf   RBC / HPF TOO NUMEROUS TO COUNT  <3 RBC/hpf   Bacteria, UA MANY (*) RARE   Urine-Other URINALYSIS PERFORMED ON SUPERNATANT    CG4 I-STAT (LACTIC ACID)     Status: Abnormal   Collection Time    07/09/12  2:01 PM      Result Value Range   Lactic Acid, Venous 3.20 (*) 0.5 - 2.2 mmol/L    Ct Abdomen Pelvis Wo Contrast  07/09/2012   *RADIOLOGY REPORT*  Clinical Data: Left side abdominal pain, hematuria and chronic UTI  CT ABDOMEN AND PELVIS WITHOUT CONTRAST  Technique:  Multidetector CT imaging of the abdomen and pelvis was performed following the standard protocol without intravenous contrast.  Comparison: None.  Findings:  Lower Chest:  Dependent atelectasis in the lower lobes.  Visualized heart within normal limits for size.  There are coarse calcifications in the coronary arteries.  No pericardial effusion. Calcifications of the aortic valve and mitral annulus. Unremarkable distal thoracic esophagus.  Abdomen: Unenhanced CT was performed per clinician order.  Lack of IV contrast limits sensitivity and specificity, especially for evaluation of abdominal/pelvic solid viscera.  Within these limitations, unremarkable CT appearance of the stomach, duodenum, spleen, adrenal glands and pancreas.  Nonspecific low attenuation soft tissue within the lesser sac interposed between the superior greater curvature of the stomach and the pancreatic body.  There is some coarse calcifications associated with the edge of this structure. Several small low attenuation lesions are identified scattered throughout the hepatic parenchyma which are incompletely characterized in the absence of intravenous contrast material.  The largest measures 12 mm the anterior aspect of hepatic segment two. Additionally, there are several coarse calcifications within the hepatic parenchyma, likely the sequelae of prior granulomatous disease. Gallbladder is unremarkable. No intra  or extrahepatic biliary ductal  dilatation.  Moderate left hydronephrosis with high attenuation material in the collecting system consistent with hematuria/thrombus.  There is an obstructing 6.5 by 10 mm stone in the proximal ureter.  Several small parapelvic cysts are noted in the upper pole.  No definite mass lesion or renal contour abnormality.  There is nonspecific perinephric stranding.  There are several punctate nonobstructing stones on the right.  Numerous parapelvic cysts noted on the right. A single 12 mm low attenuation lesion exophytic from the lateral upper pole which is incompletely characterized in the absence of intravenous contrast material.  Normal-caliber large and small bowel.  No evidence of obstruction. No significant diverticular disease.  Normal appendix in the right lower quadrant.  No free fluid.  Pelvis: The bladder is decompressed.  Unremarkable adnexa and uterus.  No free fluid or suspicious adenopathy.  Bones: No acute fracture or aggressive appearing lytic or blastic osseous lesion.  Multilevel degenerative disc disease and lower lumbar facet arthropathy.  Vascular: Atherosclerotic vascular disease without aneurysmal dilatation.  IMPRESSION:  1.  Obstructing 6.5 x 10 mm proximal left ureteral stone with associated moderate left hydronephrosis and hemorrhage within the proximal dilated collecting system.  Degree of hemorrhage is somewhat more than expected for the presence of the stone.  An underlying urothelial lesion is difficult to exclude.  Recommend urological consultation.  2.  Low attenuation somewhat amorphous mass in the lesser sac posterior to the pancreas.  The primary differential consideration is a lymphangioma. Recommend further evaluation with non emergent (outpatient) MRI of the abdomen with and without contrast to exclude other less likely neoplastic etiologies.  3.  Atherosclerosis including coronary artery disease  4.  Calcification of the aortic valve and mitral valve annulus  5. Bilateral  parapelvic renal cysts.  6.  Low attenuation lesions in the liver and right kidney are too small to characterize but statistically likely cysts.  The cystic nature of these structures could also be confirmed on the follow-up MRI recommended above.   Original Report Authenticated By: Malachy Moan, M.D.    Review of Systems  Constitutional: Positive for chills and malaise/fatigue. Negative for fever.  HENT: Negative.   Eyes: Negative.   Respiratory: Negative.   Cardiovascular: Negative.   Gastrointestinal: Positive for nausea and vomiting.  Genitourinary: Positive for frequency, hematuria and flank pain.  Musculoskeletal: Negative.   Skin: Negative.   Neurological: Negative.   Endo/Heme/Allergies: Negative.   Psychiatric/Behavioral: Negative.    Blood pressure 125/55, pulse 112, temperature 98.3 F (36.8 C), temperature source Oral, resp. rate 22, SpO2 93.00%. Physical Exam  Constitutional: She is oriented to person, place, and time. She appears well-developed and well-nourished.  HENT:  Head: Normocephalic and atraumatic.  Eyes: EOM are normal. Pupils are equal, round, and reactive to light.  Neck: Normal range of motion. Neck supple.  Cardiovascular: Regular rhythm.   tachycardic  Respiratory: Effort normal.  GI: Soft. Bowel sounds are normal.  Genitourinary:  Mild Left CVAT  Musculoskeletal: Normal range of motion.  Neurological: She is alert and oriented to person, place, and time.  Skin: Skin is warm and dry.  Psychiatric: She has a normal mood and affect. Her behavior is normal. Judgment and thought content normal.    Assessment/Plan:  1 - Left Ureteral Stone, Urosepsis - Explained potentially dangerous and life-threatening situation with infected obstructing stone as well as need for urgent renal decompression, fluids, IV ABX with definitive stone management at later date. Discussed options of neph tube v.  Stent and she has opted for trial of stent. Risks including  bleeding, infection, damage / loss of kidney, failure to place / need for nephrostomy as well as rare risks such as DVT, PT, MI, CVA, Mortality discussed. Also discussed potential of ICU admit post-op if she requires pressors.  2 - Rt > Lt Renal Cysts - will obtain dedicated axial contrast imaging when over acute stone episode to help rule out cystic neoplasm.    Cassian Torelli 07/09/2012, 3:55 PM

## 2012-07-09 NOTE — Brief Op Note (Signed)
07/09/2012  5:27 PM  PATIENT:  Julie [name]  67 y.o. female  PRE-OPERATIVE DIAGNOSIS:  left stone urosepsis  POST-OPERATIVE DIAGNOSIS:  left stone urosepsis  PROCEDURE:  Procedure(s): CYSTOSCOPY, RETROGRADE PYELOGRAM WITH LEFT STENT PLACEMENT (Left)  SURGEON:  Surgeon(s) and Role:    * Sebastian Ache, MD - Primary  PHYSICIAN ASSISTANT:   ASSISTANTS: none   ANESTHESIA:   general  EBL:  Total I/O In: 1050 [I.V.:1050] Out: -   BLOOD ADMINISTERED:none  DRAINS: 68F foley to straight drain   LOCAL MEDICATIONS USED:  NONE  SPECIMEN:  No Specimen  DISPOSITION OF SPECIMEN:  N/A  COUNTS:  YES  TOURNIQUET:  * No tourniquets in log *  DICTATION: .Other Dictation: Dictation Number [id number]  PLAN OF CARE: Admit to inpatient   PATIENT DISPOSITION:  PACU - hemodynamically stable.   Delay start of Pharmacological VTE agent (>24hrs) due to surgical blood loss or risk of bleeding: not applicable

## 2012-07-09 NOTE — Preoperative (Signed)
Beta Blockers   Reason not to administer Beta Blockers:Not Applicable 

## 2012-07-09 NOTE — Transfer of Care (Signed)
Immediate Anesthesia Transfer of Care Note  Patient: Julie [name]  Procedure(s) Performed: Procedure(s) (LRB): CYSTOSCOPY, RETROGRADE PYELOGRAM WITH LEFT STENT PLACEMENT (Left)  Patient Location: PACU  Anesthesia Type: General  Level of Consciousness: sedated, patient cooperative and responds to stimulaton  Airway & Oxygen Therapy: Patient Spontanous Breathing and Patient connected to face mask oxgen  Post-op Assessment: Report given to PACU RN and Post -op Vital signs reviewed and stable  Post vital signs: Reviewed and stable  Complications: No apparent anesthesia complications

## 2012-07-09 NOTE — Progress Notes (Signed)
Dr. Berneice Heinrich in- aware of patient's urine being bloody in appearance.

## 2012-07-09 NOTE — ED Notes (Signed)
RUE:[id number]<JW> Expected date:<BR> Expected time:<BR> Means of arrival:<BR> Comments:<BR> [facility]- 67 yo F, pt with kidney stones, going to surgery

## 2012-07-09 NOTE — ED Notes (Signed)
RIGHT ARM RESTRICTION

## 2012-07-09 NOTE — ED Notes (Signed)
Pt resting quietly. Pt denies pain or nausea at present. Pt a/o x 3. No acute distress. Pt awaiting urology consult.

## 2012-07-09 NOTE — Op Note (Signed)
Julie Julie [name], Julie [name] NO.:  [id number]  MEDICAL RECORD NO.:  [id number]  LOCATION:  1234                         FACILITY:  [facility]  PHYSICIAN:  Sebastian Ache, MD     DATE OF BIRTH:  16-Oct-1945  DATE OF PROCEDURE: 07/09/2012 DATE OF DISCHARGE:                                OPERATIVE REPORT   DIAGNOSIS:  Left ureteral stone and urosepsis.  PROCEDURE: 1. Cystoscopy with left retrograde pyelogram interpretation. 2. Insertion of left ureteral stent, 6 x 24, no tether.  FINDINGS: 1. Significant bladder erythema consistent with  cystitis. 2. Filling defect in Julie proximal ureter consistent with known stone. 3. Multiple filling defects in Julie left kidney consistent with blood     products versus abnormal urothelial tissue.  ESTIMATED BLOOD LOSS:  Nil.  COMPLICATIONS:  None.  SPECIMENS:  None.  DRAINS:  A 16-French Foley catheter straight drain.  INDICATION:  Julie Julie [name] is a very pleasant 67 year old lady with questionable 1 prior episode of nephrolithiasis who presented to Julie emergency room today at [facility] with malaise, tachycardia, hypotension, and flank pain worrisome for obstructed infected stone. She had significant pyuria.  Urgent CT scan revealed a left proximal ureteral stone with left hydronephrosis as well as bilateral renal cysts and questionable blood products within Julie left renal pelvis.  Julie Julie [name] was urgently transferred to [facility] for emergent urologic consultation was sought.  I felt that urgent renal decompression was warranted.  She was posted for emergent left ureteral stenting.  Informed consent was obtained and placed in medical record.  PROCEDURE IN DETAIL:  Julie Julie [name] being Julie Julie [name] verified.  Procedure being cystoscopy and left ureteral stent placed was confirmed. Procedure was carried out.  Time-out was performed.  Intravenous antibiotics administered.  General LMA anesthesia was introduced.  Julie Julie [name]  was placed into a low lithotomy position.  Sterile field was created by prepping and draping Julie Julie [name]'s vagina, introitus, and proximal thighs using iodine x3.  Next, cystourethroscopy was performed using a 22-French rigid scope with 30-degree offset lens.  Inspection of bladder revealed significant cloudy urine with some blood mixed and this was irrigated several times to allow better visualization of Julie bladder wall was diffusely erythematous consistent with likely cystitis, bilateral ureters were in normal anatomic position.  Julie left ureter was gently cannulated using a 6-French end-hole catheter and very gentle left retrograde pyelogram was seen.  Left retrograde pyelogram was performed using a single left ureter with single system left kidney.  There was a filling defect in proximal ureter consistent with known stone.  There were multiple filling defects within Julie renal pelvis and lower mid pole of Julie kidney consistent with blood products versus abnormal urothelial tissue.  A 0.038 Glidewire was advanced at Julie level of Julie upper pole over which a new 6 x 24 double-J stent was placed using cystoscopic and fluoroscopic guidance.  Good proximal and distal curl were noted.  Efflux of cloudy and somewhat bloody urine was seen around and through Julie distal end of Julie stent. Given Julie Julie [name]'s overall picture concerning urosepsis, Foley catheter was placed for straight drained 10 mL sterile water in Julie  balloon. Procedure was then terminated.  Julie Julie [name] tolerated Julie procedure well.  There were no immediate periprocedural complications.  Julie Julie [name] was taken to postanesthesia care unit in stable condition with plan for ICU admission.          ______________________________ Sebastian Ache, MD     TM/MEDQ  D:  07/09/2012  T:  07/09/2012  Job:  [id number]

## 2012-07-09 NOTE — ED Notes (Signed)
OR called to state that they are ready to receive pt.

## 2012-07-09 NOTE — Progress Notes (Signed)
Dr. Renold Don in- aware of patient's vital signs

## 2012-07-09 NOTE — Anesthesia Preprocedure Evaluation (Signed)
Anesthesia Evaluation  Patient identified by MRN, date of birth, ID band Patient awake    Reviewed: Allergy & Precautions, H&P , NPO status , Patient's Chart, lab work & pertinent test results  Airway Mallampati: II TM Distance: >3 FB Neck ROM: Full    Dental  (+) Dental Advisory Given   Pulmonary former smoker,          Cardiovascular negative cardio ROS  Rhythm:Regular Rate:Normal     Neuro/Psych negative neurological ROS  negative psych ROS   GI/Hepatic negative GI ROS, Neg liver ROS,   Endo/Other  negative endocrine ROS  Renal/GU negative Renal ROS  negative genitourinary   Musculoskeletal negative musculoskeletal ROS (+)   Abdominal (+) + obese,   Peds negative pediatric ROS (+)  Hematology negative hematology ROS (+)   Anesthesia Other Findings   Reproductive/Obstetrics negative OB ROS                           Anesthesia Physical Anesthesia Plan  ASA: II  Anesthesia Plan: General   Post-op Pain Management:    Induction: Intravenous  Airway Management Planned: LMA  Additional Equipment:   Intra-op Plan:   Post-operative Plan: Extubation in OR  Informed Consent: I have reviewed the patients History and Physical, chart, labs and discussed the procedure including the risks, benefits and alternatives for the proposed anesthesia with the patient or authorized representative who has indicated his/her understanding and acceptance.   Dental advisory given  Plan Discussed with: CRNA  Anesthesia Plan Comments:         Anesthesia Quick Evaluation

## 2012-07-09 NOTE — ED Notes (Signed)
Per Dr. Deretha Emory ESI level needed to be elevated to "2"

## 2012-07-09 NOTE — Anesthesia Postprocedure Evaluation (Signed)
Anesthesia Post Note  Patient: Julie [name]  Procedure(s) Performed: Procedure(s) (LRB): CYSTOSCOPY, RETROGRADE PYELOGRAM WITH LEFT STENT PLACEMENT (Left)  Anesthesia type: General  Patient location: PACU  Post pain: Pain level controlled  Post assessment: Post-op Vital signs reviewed  Last Vitals: BP 117/52  Pulse 112  Temp(Src) 36.8 C (Oral)  Resp 24  SpO2 95%  Post vital signs: Reviewed  Level of consciousness: sedated  Complications: No apparent anesthesia complications

## 2012-07-10 LAB — BASIC METABOLIC PANEL
CO2: 18 mEq/L — ABNORMAL LOW (ref 19–32)
Chloride: 103 mEq/L (ref 96–112)
Creatinine, Ser: 0.98 mg/dL (ref 0.50–1.10)

## 2012-07-10 LAB — CBC
HCT: 39.2 % (ref 36.0–46.0)
Hemoglobin: 13 g/dL (ref 12.0–15.0)
MCH: 30.4 pg (ref 26.0–34.0)
MCHC: 33.2 g/dL (ref 30.0–36.0)
MCV: 91.6 fL (ref 78.0–100.0)
Platelets: 134 10*3/uL — ABNORMAL LOW (ref 150–400)
RBC: 4.28 MIL/uL (ref 3.87–5.11)
RDW: 13.6 % (ref 11.5–15.5)
WBC: 22.1 10*3/uL — ABNORMAL HIGH (ref 4.0–10.5)

## 2012-07-10 NOTE — Progress Notes (Signed)
Pt arrived to floor at 1300, VSS, BP 104/54, no complaints, anticipated d./c tomorrow. Discussed Pneumonial vaccine, patient refuses at present, plans to address with primary at annual physical on Mon. Vaccine factsheet given.

## 2012-07-10 NOTE — Progress Notes (Signed)
Foley d/c'd

## 2012-07-10 NOTE — Progress Notes (Signed)
Julie [name] 1234 IS TRANSFERRING TO 1314. NO CHANGE IN HER CONDITION. TRANSFERRING IN W/CHAIR WITH NT'S. REPORT WAS CALLED TO ELSIE RN -FRED WILL BE HER RN ON 3RD FLOOR.

## 2012-07-10 NOTE — Progress Notes (Signed)
1 Day Post-Op   Subjective:  1 - Left Obstructing Ureteral Stone + Urosepsis - s/p emergent cysto stent 7/4 for tachycardia, hypotension, pyuria and left proximal ureteral stone with hydronephrosis. Received Natasha Bence + Rocephin periop and now emperic Zosyn. Observed in step-down overnight for monitoring post-op.  Today Mossie feels "much better". Heart rate and BP now normalized. Urine clearing. UCX pending.   Objective: Vital signs in last 24 hours: Temp:  [97.9 F (36.6 C)-99 F (37.2 C)] 97.9 F (36.6 C) (07/05 0000) Pulse Rate:  [76-133] 76 (07/05 0400) Resp:  [16-25] 23 (07/05 0145) BP: (69-125)/(36-68) 115/68 mmHg (07/05 0400) SpO2:  [91 %-99 %] 99 % (07/05 0400) Weight:  [98.2 kg (216 lb 7.9 oz)] 98.2 kg (216 lb 7.9 oz) (07/04 2000)    Intake/Output from previous day: 07/04 0701 - 07/05 0700 In: 3625 [I.V.:3575; IV Piggyback:50] Out: 75 [Urine:75] Intake/Output this shift:    General appearance: alert, cooperative and appears stated age Head: Normocephalic, without obvious abnormality, atraumatic Eyes: conjunctivae/corneas clear. PERRL, EOM's intact. Fundi benign. Ears: normal TM's and external ear canals both ears Nose: Nares normal. Septum midline. Mucosa normal. No drainage or sinus tenderness. Throat: lips, mucosa, and tongue normal; teeth and gums normal Neck: no adenopathy, no carotid bruit, no JVD, supple, symmetrical, trachea midline and thyroid not enlarged, symmetric, no tenderness/mass/nodules Back: symmetric, no curvature. ROM normal. No CVA tenderness. Resp: clear to auscultation bilaterally Chest wall: no tenderness Cardio: regular rate and rhythm, S1, S2 normal, no murmur, click, rub or gallop GI: soft, non-tender; bowel sounds normal; no masses,  no organomegaly Extremities: extremities normal, atraumatic, no cyanosis or edema Pulses: 2+ and symmetric Skin: Skin color, texture, turgor normal. No rashes or lesions Lymph nodes: Cervical, supraclavicular, and  axillary nodes normal. Neurologic: Grossly normal GU: foley c/d/i with clearing urine, now mostly yellow and w/o clots / debris  Lab Results:   Recent Labs  07/09/12 1204 07/10/12 0350  WBC 8.0 22.1*  HGB 14.9 13.0  HCT 43.0 39.2  PLT 147* 134*   BMET  Recent Labs  07/09/12 1204 07/10/12 0350  NA 141 135  K 3.9 4.5  CL 106 103  CO2 20 18*  GLUCOSE 92 250*  BUN 23 21  CREATININE 1.21* 0.98  CALCIUM 9.7 8.7   PT/INR No results found for this basename: LABPROT, INR,  in the last 72 hours ABG No results found for this basename: PHART, PCO2, PO2, HCO3,  in the last 72 hours  Studies/Results: Ct Abdomen Pelvis Wo Contrast  07/09/2012   *RADIOLOGY REPORT*  Clinical Data: Left side abdominal pain, hematuria and chronic UTI  CT ABDOMEN AND PELVIS WITHOUT CONTRAST  Technique:  Multidetector CT imaging of the abdomen and pelvis was performed following the standard protocol without intravenous contrast.  Comparison: None.  Findings:  Lower Chest:  Dependent atelectasis in the lower lobes.  Visualized heart within normal limits for size.  There are coarse calcifications in the coronary arteries.  No pericardial effusion. Calcifications of the aortic valve and mitral annulus. Unremarkable distal thoracic esophagus.  Abdomen: Unenhanced CT was performed per clinician order.  Lack of IV contrast limits sensitivity and specificity, especially for evaluation of abdominal/pelvic solid viscera.  Within these limitations, unremarkable CT appearance of the stomach, duodenum, spleen, adrenal glands and pancreas.  Nonspecific low attenuation soft tissue within the lesser sac interposed between the superior greater curvature of the stomach and the pancreatic body.  There is some coarse calcifications associated with the edge of  this structure. Several small low attenuation lesions are identified scattered throughout the hepatic parenchyma which are incompletely characterized in the absence of intravenous  contrast material.  The largest measures 12 mm the anterior aspect of hepatic segment two. Additionally, there are several coarse calcifications within the hepatic parenchyma, likely the sequelae of prior granulomatous disease. Gallbladder is unremarkable. No intra or extrahepatic biliary ductal dilatation.  Moderate left hydronephrosis with high attenuation material in the collecting system consistent with hematuria/thrombus.  There is an obstructing 6.5 by 10 mm stone in the proximal ureter.  Several small parapelvic cysts are noted in the upper pole.  No definite mass lesion or renal contour abnormality.  There is nonspecific perinephric stranding.  There are several punctate nonobstructing stones on the right.  Numerous parapelvic cysts noted on the right. A single 12 mm low attenuation lesion exophytic from the lateral upper pole which is incompletely characterized in the absence of intravenous contrast material.  Normal-caliber large and small bowel.  No evidence of obstruction. No significant diverticular disease.  Normal appendix in the right lower quadrant.  No free fluid.  Pelvis: The bladder is decompressed.  Unremarkable adnexa and uterus.  No free fluid or suspicious adenopathy.  Bones: No acute fracture or aggressive appearing lytic or blastic osseous lesion.  Multilevel degenerative disc disease and lower lumbar facet arthropathy.  Vascular: Atherosclerotic vascular disease without aneurysmal dilatation.  IMPRESSION:  1.  Obstructing 6.5 x 10 mm proximal left ureteral stone with associated moderate left hydronephrosis and hemorrhage within the proximal dilated collecting system.  Degree of hemorrhage is somewhat more than expected for the presence of the stone.  An underlying urothelial lesion is difficult to exclude.  Recommend urological consultation.  2.  Low attenuation somewhat amorphous mass in the lesser sac posterior to the pancreas.  The primary differential consideration is a lymphangioma.  Recommend further evaluation with non emergent (outpatient) MRI of the abdomen with and without contrast to exclude other less likely neoplastic etiologies.  3.  Atherosclerosis including coronary artery disease  4.  Calcification of the aortic valve and mitral valve annulus  5. Bilateral parapelvic renal cysts.  6.  Low attenuation lesions in the liver and right kidney are too small to characterize but statistically likely cysts.  The cystic nature of these structures could also be confirmed on the follow-up MRI recommended above.   Original Report Authenticated By: Malachy Moan, M.D.    Anti-infectives: Anti-infectives   Start     Dose/Rate Route Frequency Ordered Stop   07/09/12 2000  piperacillin-tazobactam (ZOSYN) IVPB 3.375 g     3.375 g 12.5 mL/hr over 240 Minutes Intravenous Every 8 hours 07/09/12 1905     07/09/12 1630  [MAR Hold]  gentamicin (GARAMYCIN) 400 mg in dextrose 5 % 100 mL IVPB     (On MAR Hold since 07/09/12 1700)  Comments:  Dose adjusted to 5 mg/kg.  5 mg/kg  79 kg [ last documented 04/12/12]  = 395 mg  Rounded to 400mg    400 mg 110 mL/hr over 60 Minutes Intravenous  Once 07/09/12 1620 07/09/12 1659   07/09/12 1615  gentamicin (GARAMYCIN) IVPB 80 mg  Status:  Discontinued    Comments:  For additional peri-operative coverage.   80 mg 100 mL/hr over 30 Minutes Intravenous  Once 07/09/12 1605 07/09/12 1617   07/09/12 1215  cefTRIAXone (ROCEPHIN) 1 g in dextrose 5 % 50 mL IVPB     1 g 100 mL/hr over 30 Minutes Intravenous  Once  07/09/12 1203 07/09/12 1341      Assessment/Plan:  1 - Left Obstructing Ureteral Stone + Urosepsis - Improved dramatically subjectively and objectively. Leukocytosis noted and expected. Will transfer to floor, DC foley. DC home when [facility] finalized (likely tomorrow) for ABX planning. We will address her left stone with ureteroscopy in several weeks for this and to verify no additional urothelial abnormalites given amount of blood an soft tissue in  renal pelvis on initial imaging.    [facility], Jasmine Mcbeth 07/10/2012

## 2012-07-11 LAB — URINE CULTURE

## 2012-07-11 MED ORDER — SULFAMETHOXAZOLE-TRIMETHOPRIM 800-160 MG PO TABS: 1.0000 | ORAL_TABLET | Freq: Two times a day (BID) | ORAL | Status: DC

## 2012-07-11 MED ORDER — OXYCODONE-ACETAMINOPHEN 5-325 MG PO TABS: 1.0000 | ORAL_TABLET | ORAL | Status: DC | PRN

## 2012-07-11 NOTE — Progress Notes (Signed)
Discharge instructions explained to pt and prescription given to her. Urine culture results called to Dr. Wilson Singer.

## 2012-07-11 NOTE — Plan of Care (Signed)
Problem: Discharge Progression Outcomes Goal: Discharge plan in place and appropriate Outcome: Adequate for Discharge Discharge home

## 2012-07-11 NOTE — Plan of Care (Signed)
Problem: Discharge Progression Outcomes Goal: Activity appropriate for discharge plan Outcome: Completed/Met Date Met:  07/11/12 Ambulating in the halls

## 2012-07-11 NOTE — Discharge Summary (Signed)
Physician Discharge Summary  Patient ID: Julie [name] MRN: [id number] DOB/AGE: 05-17-1945 67 y.o.  Admit date: 07/09/2012 Discharge date: 07/11/2012  Admission Diagnoses: left ureteral stone with infection  Discharge Diagnoses: same Active Problems:   As above   Discharged Condition: good  Hospital Course: Julie [name] was admitted with a left ureteral stone with obstruction and impending sepsis.  She had a stent placed Friday afternoon and was in step down on antibiotics.  She has improved dramatically and is now afebrile and assymptomatic.  Her culture grew and pansensitive e. Coli.    Consults: None  Significant Diagnostic Studies: CT  Treatments: IV antibiotics and stent placement.   Discharge Exam: Blood pressure 139/68, pulse 85, temperature 97.4 F (36.3 C), temperature source Oral, resp. rate 16, height 5\' 3"  (1.6 m), weight 99.791 kg (220 lb), SpO2 96.00%. General appearance: alert and no distress  Disposition: 01-Home or Self Care  Discharge Orders   Future Appointments Provider Department Dept Phone   10/15/2012 2:00 PM Mauri [facility] MEDICAL ONCOLOGY [phone]   10/15/2012 2:30 PM Victorino December, MD [facility] MEDICAL ONCOLOGY [phone]   Future Orders Complete By Expires     Diet - low sodium heart healthy  As directed     Increase activity slowly  As directed         Medication List         anastrozole 1 MG tablet  Commonly known as:  ARIMIDEX  Take 1 mg by mouth daily.     atorvastatin 10 MG tablet  Commonly known as:  LIPITOR  Take 10 mg by mouth Daily.     CALCIUM + D 600-200 MG-UNIT Tabs  Generic drug:  Calcium Carbonate-Vitamin D  Take 1 tablet by mouth daily.     lisinopril 10 MG tablet  Commonly known as:  PRINIVIL,ZESTRIL  Take 10 mg by mouth Daily.     oxyCODONE-acetaminophen 5-325 MG per tablet  Commonly known as:  ROXICET  Take 1 tablet by mouth every 4 (four) hours as needed for pain.      sulfamethoxazole-trimethoprim 800-160 MG per tablet  Commonly known as:  BACTRIM DS  Take 1 tablet by mouth 2 (two) times daily.     VITAMIN D-3 PO  Take 2,000 Units by mouth daily.           Follow-up Information   Call Sebastian Ache, MD. (for appt for next week. )    Contact information:   [address] [place] [address] [phone]       Signed: Anner Crete 07/11/2012, 4:26 PM

## 2012-07-11 NOTE — Progress Notes (Signed)
Patient ID: Julie [name], female   DOB: [birth year], 67 y.o.   MRN: [id number] 2 Days Post-Op  Subjective: Julie [name] is doing well post stent placement and abx therapy.   She has minimal pain and no voiding complaints.  She is afebrile.   Her urine culture is still pending.  ROS: Negative except as above  Objective: Vital signs in last 24 hours: Temp:  [97.8 F (36.6 C)-98.2 F (36.8 C)] 98.2 F (36.8 C) (07/06 0500) Pulse Rate:  [67-83] 71 (07/06 0500) Resp:  [14-25] 18 (07/06 0500) BP: (96-135)/(47-67) 135/57 mmHg (07/06 0500) SpO2:  [96 %-98 %] 97 % (07/06 0500) Weight:  [99.791 kg (220 lb)] 99.791 kg (220 lb) (07/06 0500)  Intake/Output from previous day: 07/05 0701 - 07/06 0700 In: 1035 [P.O.:360; I.V.:625; IV Piggyback:50] Out: 950 [Urine:950] Intake/Output this shift: Total I/O In: 240 [P.O.:240] Out: -   General appearance: alert and no distress Resp: clear to auscultation bilaterally Cardio: regularly irregular rhythm GI: soft with minimal LCVAT.   Lab Results:   Recent Labs  07/09/12 1204 07/10/12 0350  WBC 8.0 22.1*  HGB 14.9 13.0  HCT 43.0 39.2  PLT 147* 134*   BMET  Recent Labs  07/09/12 1204 07/10/12 0350  NA 141 135  K 3.9 4.5  CL 106 103  CO2 20 18*  GLUCOSE 92 250*  BUN 23 21  CREATININE 1.21* 0.98  CALCIUM 9.7 8.7   PT/INR No results found for this basename: LABPROT, INR,  in the last 72 hours ABG No results found for this basename: PHART, PCO2, PO2, HCO3,  in the last 72 hours  Studies/Results: Ct Abdomen Pelvis Wo Contrast  07/09/2012   *RADIOLOGY REPORT*  Clinical Data: Left side abdominal pain, hematuria and chronic UTI  CT ABDOMEN AND PELVIS WITHOUT CONTRAST  Technique:  Multidetector CT imaging of the abdomen and pelvis was performed following the standard protocol without intravenous contrast.  Comparison: None.  Findings:  Lower Chest:  Dependent atelectasis in the lower lobes.  Visualized heart within normal limits for size.   There are coarse calcifications in the coronary arteries.  No pericardial effusion. Calcifications of the aortic valve and mitral annulus. Unremarkable distal thoracic esophagus.  Abdomen: Unenhanced CT was performed per clinician order.  Lack of IV contrast limits sensitivity and specificity, especially for evaluation of abdominal/pelvic solid viscera.  Within these limitations, unremarkable CT appearance of the stomach, duodenum, spleen, adrenal glands and pancreas.  Nonspecific low attenuation soft tissue within the lesser sac interposed between the superior greater curvature of the stomach and the pancreatic body.  There is some coarse calcifications associated with the edge of this structure. Several small low attenuation lesions are identified scattered throughout the hepatic parenchyma which are incompletely characterized in the absence of intravenous contrast material.  The largest measures 12 mm the anterior aspect of hepatic segment two. Additionally, there are several coarse calcifications within the hepatic parenchyma, likely the sequelae of prior granulomatous disease. Gallbladder is unremarkable. No intra or extrahepatic biliary ductal dilatation.  Moderate left hydronephrosis with high attenuation material in the collecting system consistent with hematuria/thrombus.  There is an obstructing 6.5 by 10 mm stone in the proximal ureter.  Several small parapelvic cysts are noted in the upper pole.  No definite mass lesion or renal contour abnormality.  There is nonspecific perinephric stranding.  There are several punctate nonobstructing stones on the right.  Numerous parapelvic cysts noted on the right. A single 12 mm low attenuation lesion  exophytic from the lateral upper pole which is incompletely characterized in the absence of intravenous contrast material.  Normal-caliber large and small bowel.  No evidence of obstruction. No significant diverticular disease.  Normal appendix in the right lower  quadrant.  No free fluid.  Pelvis: The bladder is decompressed.  Unremarkable adnexa and uterus.  No free fluid or suspicious adenopathy.  Bones: No acute fracture or aggressive appearing lytic or blastic osseous lesion.  Multilevel degenerative disc disease and lower lumbar facet arthropathy.  Vascular: Atherosclerotic vascular disease without aneurysmal dilatation.  IMPRESSION:  1.  Obstructing 6.5 x 10 mm proximal left ureteral stone with associated moderate left hydronephrosis and hemorrhage within the proximal dilated collecting system.  Degree of hemorrhage is somewhat more than expected for the presence of the stone.  An underlying urothelial lesion is difficult to exclude.  Recommend urological consultation.  2.  Low attenuation somewhat amorphous mass in the lesser sac posterior to the pancreas.  The primary differential consideration is a lymphangioma. Recommend further evaluation with non emergent (outpatient) MRI of the abdomen with and without contrast to exclude other less likely neoplastic etiologies.  3.  Atherosclerosis including coronary artery disease  4.  Calcification of the aortic valve and mitral valve annulus  5. Bilateral parapelvic renal cysts.  6.  Low attenuation lesions in the liver and right kidney are too small to characterize but statistically likely cysts.  The cystic nature of these structures could also be confirmed on the follow-up MRI recommended above.   Original Report Authenticated By: Malachy Moan, M.D.    Anti-infectives: Anti-infectives   Start     Dose/Rate Route Frequency Ordered Stop   07/09/12 2000  piperacillin-tazobactam (ZOSYN) IVPB 3.375 g     3.375 g 12.5 mL/hr over 240 Minutes Intravenous Every 8 hours 07/09/12 1905     07/09/12 1630  [MAR Hold]  gentamicin (GARAMYCIN) 400 mg in dextrose 5 % 100 mL IVPB     (On MAR Hold since 07/09/12 1700)  Comments:  Dose adjusted to 5 mg/kg.  5 mg/kg  79 kg [ last documented 04/12/12]  = 395 mg  Rounded to 400mg     400 mg 110 mL/hr over 60 Minutes Intravenous  Once 07/09/12 1620 07/09/12 1659   07/09/12 1615  gentamicin (GARAMYCIN) IVPB 80 mg  Status:  Discontinued    Comments:  For additional peri-operative coverage.   80 mg 100 mL/hr over 30 Minutes Intravenous  Once 07/09/12 1605 07/09/12 1617   07/09/12 1215  cefTRIAXone (ROCEPHIN) 1 g in dextrose 5 % 50 mL IVPB     1 g 100 mL/hr over 30 Minutes Intravenous  Once 07/09/12 1203 07/09/12 1341      Current Facility-Administered Medications  Medication Dose Route Frequency Provider Last Rate Last Dose  . anastrozole (ARIMIDEX) tablet 1 mg  1 mg Oral Daily Sebastian Ache, MD   1 mg at 07/10/12 1610  . atorvastatin (LIPITOR) tablet 10 mg  10 mg Oral q1800 Sebastian Ache, MD   10 mg at 07/10/12 2111  . dextrose 5 % and 0.45 % NaCl with KCl 20 mEq/L infusion   Intravenous Continuous Sebastian Ache, MD 50 mL/hr at 07/11/12 0501    . docusate sodium (COLACE) capsule 100 mg  100 mg Oral BID Sebastian Ache, MD   100 mg at 07/10/12 2111  . heparin injection 5,000 Units  5,000 Units Subcutaneous Q8H Sebastian Ache, MD   5,000 Units at 07/11/12 [phone]  . HYDROmorphone (DILAUDID) injection 0.5-1 mg  0.5-1 mg Intravenous Q2H PRN Sebastian Ache, MD      . ondansetron [facility]) injection 4 mg  4 mg Intravenous Q4H PRN Sebastian Ache, MD      . oxyCODONE-acetaminophen (PERCOCET/ROXICET) 5-325 MG per tablet 1-2 tablet  1-2 tablet Oral Q4H PRN Sebastian Ache, MD   1 tablet at 07/10/12 [id number]  . piperacillin-tazobactam (ZOSYN) IVPB 3.375 g  3.375 g Intravenous Q8H Sebastian Ache, MD   3.375 g at 07/11/12 0500  . senna (SENOKOT) tablet 8.6 mg  1 tablet Oral BID Sebastian Ache, MD   8.6 mg at 07/10/12 2111    Assessment: s/p Procedure(s): CYSTOSCOPY, RETROGRADE PYELOGRAM WITH LEFT STENT PLACEMENT  Left proximal stone with sepsis improving post stent placement and IV antibiotic therapy.   Plan: Continue IV antbiotics pending culture results.     LOS: 2 days     Anner Crete 07/11/2012

## 2012-07-12 ENCOUNTER — Encounter (HOSPITAL_COMMUNITY): Payer: Self-pay | Admitting: Urology

## 2012-07-19 ENCOUNTER — Other Ambulatory Visit: Payer: Self-pay | Admitting: Urology

## 2012-07-20 ENCOUNTER — Encounter (HOSPITAL_COMMUNITY): Payer: Self-pay | Admitting: Pharmacy Technician

## 2012-07-21 ENCOUNTER — Ambulatory Visit (HOSPITAL_COMMUNITY)
Admission: RE | Admit: 2012-07-21 | Discharge: 2012-07-21 | Disposition: A | Discharge status: Home / Self Care | Payer: Medicare Other | Source: Ambulatory Visit | Attending: Urology | Admitting: Urology

## 2012-07-21 ENCOUNTER — Encounter (HOSPITAL_COMMUNITY): Payer: Self-pay

## 2012-07-21 ENCOUNTER — Encounter (HOSPITAL_COMMUNITY)
Admission: RE | Admit: 2012-07-21 | Discharge: 2012-07-21 | Disposition: A | Discharge status: Home / Self Care | Payer: Medicare Other | Source: Ambulatory Visit | Attending: Urology | Admitting: Urology

## 2012-07-21 DIAGNOSIS — Z01810 Encounter for preprocedural cardiovascular examination: Secondary | ICD-10-CM | POA: Insufficient documentation

## 2012-07-21 DIAGNOSIS — N201 Calculus of ureter: Secondary | ICD-10-CM | POA: Insufficient documentation

## 2012-07-21 DIAGNOSIS — R239 Unspecified skin changes: Secondary | ICD-10-CM

## 2012-07-21 DIAGNOSIS — Z01818 Encounter for other preprocedural examination: Secondary | ICD-10-CM | POA: Insufficient documentation

## 2012-07-21 HISTORY — DX: Essential (primary) hypertension: I10

## 2012-07-21 HISTORY — DX: Unspecified skin changes: R23.9

## 2012-07-21 LAB — CBC
HCT: 41.9 % (ref 36.0–46.0)
Hemoglobin: 14 g/dL (ref 12.0–15.0)
MCH: 30.3 pg (ref 26.0–34.0)
MCV: 90.7 fL (ref 78.0–100.0)
Platelets: 280 10*3/uL (ref 150–400)
RBC: 4.62 MIL/uL (ref 3.87–5.11)
WBC: 6.8 10*3/uL (ref 4.0–10.5)

## 2012-07-21 NOTE — Patient Instructions (Signed)
20 MEELA WAREING  07/21/2012   Your procedure is scheduled on:   7-23--2014  Report to [facility] at      1100  AM .  Call this number if you have problems the morning of surgery: [phone]  Or Presurgical Testing [phone](Les Longmore)      Do not eat food:After Midnight.  May have clear liquids:up to 6 Hours before arrival. Nothing after : 0700 AM  Clear liquids include soda, tea, black coffee, apple or grape juice, broth.  Take these medicines the morning of surgery with A SIP OF WATER:  Anastrozole   Do not wear jewelry, make-up or nail polish.  Do not wear lotions, powders, or perfumes. You may wear deodorant.  Do not shave 12 hours prior to first CHG shower(legs and under arms).(face and neck okay.)  Do not bring valuables to the hospital.  Contacts, dentures or bridgework,body piercing,  may not be worn into surgery.  Leave suitcase in the car. After surgery it may be brought to your room.  For patients admitted to the hospital, checkout time is 11:00 AM the day of discharge.   Patients discharged the day of surgery will not be allowed to drive home. Must have responsible person with you x 24 hours once discharged.  Name and phone number of your driver: Carmyn Hamm [phone] cell  Special Instructions: CHG(Chlorhedine 4%-"Hibiclens","Betasept","Aplicare") Shower Use Special Wash: see special instructions.(avoid face and genitals)       Failure to follow these instructions may result in Cancellation of your surgery.   Patient signature_______________________________________________________

## 2012-07-21 NOTE — Pre-Procedure Instructions (Signed)
07-21-12 EKG/ CXR done today

## 2012-07-27 MED ORDER — GENTAMICIN SULFATE 40 MG/ML IJ SOLN
310.0000 mg | INTRAVENOUS | Status: AC
  Administered 2012-07-28: 310 mg via INTRAVENOUS
  Filled 2012-07-27 (×2): qty 7.75

## 2012-07-28 ENCOUNTER — Encounter (HOSPITAL_COMMUNITY)
Admission: RE | Disposition: A | Discharge status: Home / Self Care | Payer: Self-pay | Source: Ambulatory Visit | Attending: Urology

## 2012-07-28 ENCOUNTER — Ambulatory Visit (HOSPITAL_COMMUNITY): Payer: Medicare Other | Admitting: Anesthesiology

## 2012-07-28 ENCOUNTER — Encounter (HOSPITAL_COMMUNITY): Payer: Self-pay | Admitting: *Deleted

## 2012-07-28 ENCOUNTER — Ambulatory Visit (HOSPITAL_COMMUNITY)
Admission: RE | Admit: 2012-07-28 | Discharge: 2012-07-28 | Disposition: A | Discharge status: Home / Self Care | Payer: Medicare Other | Source: Ambulatory Visit | Attending: Urology | Admitting: Urology

## 2012-07-28 ENCOUNTER — Encounter (HOSPITAL_COMMUNITY): Payer: Self-pay | Admitting: Anesthesiology

## 2012-07-28 DIAGNOSIS — I10 Essential (primary) hypertension: Secondary | ICD-10-CM | POA: Insufficient documentation

## 2012-07-28 DIAGNOSIS — Z79899 Other long term (current) drug therapy: Secondary | ICD-10-CM | POA: Insufficient documentation

## 2012-07-28 DIAGNOSIS — N201 Calculus of ureter: Secondary | ICD-10-CM | POA: Insufficient documentation

## 2012-07-28 HISTORY — DX: Unspecified renal colic: N23

## 2012-07-28 HISTORY — PX: CYSTOSCOPY WITH RETROGRADE PYELOGRAM, URETEROSCOPY AND STENT PLACEMENT: SHX5789

## 2012-07-28 HISTORY — PX: HOLMIUM LASER APPLICATION: SHX5852

## 2012-07-28 SURGERY — CYSTOURETEROSCOPY, WITH RETROGRADE PYELOGRAM AND STENT INSERTION
Anesthesia: General | Laterality: Left | Wound class: Clean Contaminated

## 2012-07-28 MED ORDER — FENTANYL CITRATE 0.05 MG/ML IJ SOLN
INTRAMUSCULAR | Status: DC | PRN
  Administered 2012-07-28 (×3): 50 ug via INTRAVENOUS
  Administered 2012-07-28: 100 ug via INTRAVENOUS

## 2012-07-28 MED ORDER — SCOPOLAMINE 1 MG/3DAYS TD PT72
1.0000 | MEDICATED_PATCH | TRANSDERMAL | Status: DC
  Administered 2012-07-28: 1.5 mg via TRANSDERMAL
  Filled 2012-07-28: qty 1

## 2012-07-28 MED ORDER — MIDAZOLAM HCL 5 MG/5ML IJ SOLN
INTRAMUSCULAR | Status: DC | PRN
  Administered 2012-07-28: 2 mg via INTRAVENOUS

## 2012-07-28 MED ORDER — IOHEXOL 300 MG/ML  SOLN
INTRAMUSCULAR | Status: DC | PRN
  Administered 2012-07-28: 9 mL

## 2012-07-28 MED ORDER — SODIUM CHLORIDE 0.9 % IR SOLN
Status: DC | PRN
  Administered 2012-07-28: 4000 mL

## 2012-07-28 MED ORDER — PROPOFOL 10 MG/ML IV BOLUS
INTRAVENOUS | Status: DC | PRN
  Administered 2012-07-28: 150 mg via INTRAVENOUS

## 2012-07-28 MED ORDER — PROMETHAZINE HCL 25 MG/ML IJ SOLN: 6.2500 mg | INTRAMUSCULAR | Status: DC | PRN

## 2012-07-28 MED ORDER — ONDANSETRON HCL 4 MG/2ML IJ SOLN
INTRAMUSCULAR | Status: DC | PRN
  Administered 2012-07-28: 4 mg via INTRAVENOUS

## 2012-07-28 MED ORDER — SULFAMETHOXAZOLE-TMP DS 800-160 MG PO TABS: 1.0000 | ORAL_TABLET | Freq: Every day | ORAL | Status: DC

## 2012-07-28 MED ORDER — SCOPOLAMINE 1 MG/3DAYS TD PT72
MEDICATED_PATCH | TRANSDERMAL | Status: AC
  Filled 2012-07-28: qty 1

## 2012-07-28 MED ORDER — IOHEXOL 300 MG/ML  SOLN
INTRAMUSCULAR | Status: AC
  Filled 2012-07-28: qty 1

## 2012-07-28 MED ORDER — LACTATED RINGERS IV SOLN
INTRAVENOUS | Status: DC
  Administered 2012-07-28: 1000 mL via INTRAVENOUS

## 2012-07-28 MED ORDER — FENTANYL CITRATE 0.05 MG/ML IJ SOLN: 25.0000 ug | INTRAMUSCULAR | Status: DC | PRN

## 2012-07-28 MED ORDER — OXYCODONE-ACETAMINOPHEN 5-325 MG PO TABS: 1.0000 | ORAL_TABLET | ORAL | Status: DC | PRN

## 2012-07-28 MED ORDER — DEXAMETHASONE SODIUM PHOSPHATE 10 MG/ML IJ SOLN
INTRAMUSCULAR | Status: DC | PRN
  Administered 2012-07-28: 10 mg via INTRAVENOUS

## 2012-07-28 MED ORDER — SENNOSIDES-DOCUSATE SODIUM 8.6-50 MG PO TABS: 1.0000 | ORAL_TABLET | Freq: Two times a day (BID) | ORAL | Status: DC

## 2012-07-28 SURGICAL SUPPLY — 22 items
BAG URINE DRAINAGE (UROLOGICAL SUPPLIES) IMPLANT
BASKET LASER NITINOL 1.9FR (BASKET) ×2 IMPLANT
BASKET STNLS GEMINI 4WIRE 3FR (BASKET) IMPLANT
BASKET ZERO TIP NITINOL 2.4FR (BASKET) IMPLANT
BSKT STON RTRVL 120 1.9FR (BASKET) ×1
BSKT STON RTRVL ZERO TP 2.4FR (BASKET)
CATH INTERMIT  6FR 70CM (CATHETERS) ×2 IMPLANT
DRAPE CAMERA CLOSED 9X96 (DRAPES) ×2 IMPLANT
ELECT REM PT RETURN 9FT ADLT (ELECTROSURGICAL)
ELECTRODE REM PT RTRN 9FT ADLT (ELECTROSURGICAL) IMPLANT
GLOVE BIOGEL M STRL SZ7.5 (GLOVE) ×2 IMPLANT
GOWN PREVENTION PLUS LG XLONG (DISPOSABLE) ×2 IMPLANT
GUIDEWIRE ANG ZIPWIRE 038X150 (WIRE) IMPLANT
GUIDEWIRE STR DUAL SENSOR (WIRE) ×2 IMPLANT
IV NS IRRIG 3000ML ARTHROMATIC (IV SOLUTION) ×2 IMPLANT
LASER FIBER DISP (UROLOGICAL SUPPLIES) ×2 IMPLANT
PACK CYSTO (CUSTOM PROCEDURE TRAY) ×2 IMPLANT
SHEATH ACCESS URETERAL 24CM (SHEATH) ×2 IMPLANT
STENT CONTOUR 6FRX24X.038 (STENTS) ×2 IMPLANT
SYRINGE 10CC LL (SYRINGE) IMPLANT
SYRINGE IRR TOOMEY STRL 70CC (SYRINGE) IMPLANT
TUBE FEEDING 8FR 16IN STR KANG (MISCELLANEOUS) ×2 IMPLANT

## 2012-07-28 NOTE — Op Note (Signed)
NAMETOMI, GRANDPRE NO.:  [id number]  MEDICAL RECORD NO.:  [id number]  LOCATION:  [facility]                         FACILITY:  [facility]  PHYSICIAN:  Sebastian Ache, MD     DATE OF BIRTH:  03-11-1945  DATE OF PROCEDURE:  07/28/2012 DATE OF DISCHARGE:  07/28/2012                              OPERATIVE REPORT   DIAGNOSIS:  History of urosepsis and left ureteral stone.  PROCEDURE: 1. Cystoscopy, with left retrograde pyelogram and interpretation. 2. Left ureteroscopy with laser lithotripsy. 3. Exchange of left ureteral stent with tether to the left thigh.  FINDINGS: 1. Unremarkable urinary bladder. 2. Large mid left ureteral stone. 3. Otherwise, unremarkable left retrograde pyelogram.  ESTIMATED BLOOD LOSS:  Nil.  DRAINS:  None.  SPECIMEN:  Left ureteral stone fragments for compositional analysis.  INDICATION:  Julie [name] is a very pleasant 67 year old lady with recent history of urosepsis and obstructing left ureteral stone.  She underwent emergent ureteral stenting previously in treatment for and her infection, which she has cleared as most recent urine culture negative. She now presents for definitive management with ureteroscopic stone manipulation.  Informed consent was obtained and placed in the medical record.  PROCEDURE IN DETAIL:  The patient being Julie [name] is verified. Procedure being left ureteroscopic stone manipulation was confirmed. The procedure was carried out.  Time-out was performed.  Intravenous antibiotics administered.  General LMA anesthesia was introduced.  The patient was placed into a low lithotomy position.  The sterile field was created by prepping and draping the patient's vagina, introitus, and proximal thighs using iodine x3.  Next, cystourethroscopy was performed using a 22-French rigid cystoscope with 12-degree offset lens.  This revealed a distal end of the left ureteral stent in situ.  This was grasped and brought to level  of urethral meatus through which a 0.038 Glidewire was advanced to the level of the upper pole.  Next, a 6-French end-hole catheter was placed in the same location for final left retrograde pyelogram.  Left retrograde pyelogram demonstrates a single left ureter, single system left kidney.  There was a filling defect in the mid ureter consistent with known stone without hydroureteronephrosis.  A 0.038 Glidewire was again readvanced to the upper pole and set aside as a safety wire.  Next, semi-rigid ureteroscopy was performed of the entire length of the left ureter with a 6.4-French semi-rigid ureteroscope alongside a separate Sensor working wire.  An 8-French feeding tube in urinary bladder for pressure release.  Inspection of the proximal ureter did reveal a large void stone which appeared to be much too large for simple basketing.  As such, holmium was introduced, was applied the stone using a 200 nanometer fiber at settings of 0.5 joules and 5 hertz fragmenting approximately 4 smaller fragments, each of these were grasped, brought out in their entirety set aside for compositional analysis.  It was visualized that some fragments had migrated proximally and it was felt that flexible ureteroscopy was warranted to verify and retrieve these.  As such, the semi-rigid ureteroscope was exchanged for the 12/14, 24 cm ureteral access sheath over the sensor working wire using fluoroscopic guidance.  Next, flexible ureteroscopy was  performed of the left proximal ureter and entire left kidney.  Systematic inspection of each calix did reveal several small fragments that were grasped and brought out in their entirety.  Repeat examination revealed excellent hemostasis.  No evidence of perforation and complete resolution of all stone fragments larger than 1/3 mm in diameter.  The sheath was then removed under continuous ureteroscopic vision.  No mucosal abnormalities or calcifications were encountered.   Finally a new 6 x 24 double-J stent was placed over the remaining safety wire using fluoroscopic guidance.  Good proximal and distal curl were noted. Tether was left in place and fashioned to the left thigh.  Procedure was then terminated.  The patient tolerated the procedure well.  There were no immediate periprocedural complications.  The patient was taken to Postanesthesia Care Unit in stable condition.          ______________________________ Sebastian Ache, MD     TM/MEDQ  D:  07/28/2012  T:  07/28/2012  Job:  [id number]

## 2012-07-28 NOTE — H&P (Signed)
Julie [name] is an 67 y.o. female.    Chief Complaint: Preop Left Ureteroscopic Stone Manipulation  HPI:    1- Left Ureteral Stone with h/o  Urosepsis - s/p cysto and left stent 07/09/12 for left proximal ureteral stone (9mm fusiform) with moderate hydro and likely blood products in left renal pelvis and pyuria + tachycardia 120's and hypotention SBP 90's in ER by CT on w/u for flank pain, malaise, hematuria. One possible prior stone episode. UCX from that time revealed pan-sensitive e. coli.  Today Julie [name] is seen to proceed with left ureteroscopy for her stone.  Deneis interval fevers or stone passage. Interval UCX negative.   Past Medical History  Diagnosis Date  . Nipple discharge     right-all resolved after surgery  . Colon polyp   . Hypertension   . Cancer     4'12-breast rt; lumpectomy,radiation/Dr. Park Breed, oncology  . Skin change 07-21-12    right abdomen LQ-due to past hx. Heparin injections  . Renal colic     Past Surgical History  Procedure Laterality Date  . Cesarean section    . Parathyroidectomy    . Appendectomy    . Cystoscopy with stent placement Left 07/09/2012    Procedure: CYSTOSCOPY, RETROGRADE PYELOGRAM WITH LEFT STENT PLACEMENT;  Surgeon: Sebastian Ache, MD;  Location: [facility];  Service: Urology;  Laterality: Left;  . Breast surgery Right     lumpectomy    Family History  Problem Relation Age of Onset  . Hypertension Mother   . Hyperlipidemia Mother   . Hypertension Father   . Hyperlipidemia Father    Social History:  reports that she quit smoking about 44 years ago. She does not have any smokeless tobacco history on file. She reports that she does not drink alcohol or use illicit drugs.  Allergies: No Known Allergies  Medications Prior to Admission  Medication Sig Dispense Refill  . anastrozole (ARIMIDEX) 1 MG tablet Take 1 mg by mouth daily.      Marland Kitchen atorvastatin (LIPITOR) 10 MG tablet Take 10 mg by mouth every evening.       . Calcium Carbonate-Vitamin  D (CALCIUM + D) 600-200 MG-UNIT TABS Take 1 tablet by mouth daily.       . Cholecalciferol (VITAMIN D-3) 5000 UNITS TABS Take 1 tablet by mouth daily.      Marland Kitchen losartan (COZAAR) 25 MG tablet Take 25 mg by mouth every morning.      Marland Kitchen aspirin EC 81 MG tablet Take 81 mg by mouth daily.        No results found for this or any previous visit (from the past 48 hour(s)). No results found.  Review of Systems  Constitutional: Negative.  Negative for fever and chills.  HENT: Negative.   Eyes: Negative.   Respiratory: Negative.   Cardiovascular: Negative.   Gastrointestinal: Negative.   Genitourinary: Negative.   Musculoskeletal: Negative.   Skin: Negative.   Neurological: Negative.   Endo/Heme/Allergies: Negative.   Psychiatric/Behavioral: Negative.     Blood pressure 138/66, pulse 85, temperature 98.6 F (37 C), temperature source Oral, resp. rate 16, SpO2 100.00%. Physical Exam  Constitutional: She is oriented to person, place, and time. She appears well-developed and well-nourished.  HENT:  Head: Normocephalic and atraumatic.  Eyes: EOM are normal. Pupils are equal, round, and reactive to light.  Neck: Normal range of motion. Neck supple.  Cardiovascular: Normal rate and regular rhythm.   Respiratory: Effort normal and breath sounds normal.  GI: Soft.  Bowel sounds are normal.  Genitourinary:  N oCVAT  Musculoskeletal: Normal range of motion.  Neurological: She is alert and oriented to person, place, and time.  Skin: Skin is warm and dry.  Psychiatric: She has a normal mood and affect. Her behavior is normal. Judgment and thought content normal.     Assessment/Plan  1- Left Ureteral Stone with h/o  Urosepsis -  We rediscussed ureteroscopic stone manipulation with basketing and laser-lithotripsy in detail.  We rediscussed risks including bleeding, infection, damage to kidney / ureter  bladder, rarely loss of kidney. We rediscussed anesthetic risks and rare but serious surgical  complications including DVT, PE, MI, and mortality. We specifically readdressed that in 5-10% of cases a staged approach is required with stenting followed by re-attempt ureteroscopy if anatomy unfavorable. The patient voiced understanding and wises to proceed.     Julie [name] 07/28/2012, 1:16 PM

## 2012-07-28 NOTE — Anesthesia Preprocedure Evaluation (Addendum)
Anesthesia Evaluation  Patient identified by MRN, date of birth, ID band Patient awake    Reviewed: Allergy & Precautions, H&P , NPO status , Patient's Chart, lab work & pertinent test results  Airway Mallampati: II TM Distance: >3 FB Neck ROM: Full    Dental no notable dental hx.    Pulmonary neg pulmonary ROS,  breath sounds clear to auscultation  Pulmonary exam normal       Cardiovascular Exercise Tolerance: Good hypertension, Pt. on medications negative cardio ROS  Rhythm:Regular Rate:Normal     Neuro/Psych negative neurological ROS  negative psych ROS   GI/Hepatic negative GI ROS, Neg liver ROS,   Endo/Other  negative endocrine ROS  Renal/GU negative Renal ROS  negative genitourinary   Musculoskeletal negative musculoskeletal ROS (+)   Abdominal   Peds negative pediatric ROS (+)  Hematology negative hematology ROS (+)   Anesthesia Other Findings   Reproductive/Obstetrics negative OB ROS                           Anesthesia Physical Anesthesia Plan  ASA: II  Anesthesia Plan: General   Post-op Pain Management:    Induction: Intravenous  Airway Management Planned: LMA  Additional Equipment:   Intra-op Plan:   Post-operative Plan: Extubation in OR  Informed Consent: I have reviewed the patients History and Physical, chart, labs and discussed the procedure including the risks, benefits and alternatives for the proposed anesthesia with the patient or authorized representative who has indicated his/her understanding and acceptance.   Dental advisory given  Plan Discussed with: CRNA  Anesthesia Plan Comments: (Scopolamine patch on. She did well with scop. Patch 07-09-12)       Anesthesia Quick Evaluation

## 2012-07-28 NOTE — Preoperative (Signed)
Beta Blockers   Reason not to administer Beta Blockers:Not Applicable 

## 2012-07-28 NOTE — Brief Op Note (Signed)
07/28/2012  3:05 PM  PATIENT:  Julie [name]  67 y.o. female  PRE-OPERATIVE DIAGNOSIS:  LEFT URETERAL STONE  POST-OPERATIVE DIAGNOSIS:  LEFT URETERAL STONE  PROCEDURE:  Procedure(s): CYSTOSCOPY WITH LEFT RETROGRADE PYELOGRAM, LEFT URETEROSCOPY AND STENT PLACEMENT (Left) HOLMIUM LASER APPLICATION (Left)  SURGEON:  Surgeon(s) and Role:    * Sebastian Ache, MD - Primary  PHYSICIAN ASSISTANT:   ASSISTANTS: none   ANESTHESIA:   general  EBL:     BLOOD ADMINISTERED:none  DRAINS: none   LOCAL MEDICATIONS USED:  NONE  SPECIMEN:  Source of Specimen:  Left Ureteral Stone  DISPOSITION OF SPECIMEN:  [facility] for Compositional Analysis  COUNTS:  YES  TOURNIQUET:  * No tourniquets in log *  DICTATION: .Other Dictation: Dictation Number [id number]  PLAN OF CARE: Discharge to home after PACU  PATIENT DISPOSITION:  PACU - hemodynamically stable.   Delay start of Pharmacological VTE agent (>24hrs) due to surgical blood loss or risk of bleeding: no

## 2012-07-28 NOTE — Transfer of Care (Signed)
Immediate Anesthesia Transfer of Care Note  Patient: Julie [name]  Procedure(s) Performed: Procedure(s): CYSTOSCOPY WITH LEFT RETROGRADE PYELOGRAM, LEFT URETEROSCOPY AND STENT PLACEMENT (Left) HOLMIUM LASER APPLICATION (Left)  Patient Location: PACU  Anesthesia Type:General  Level of Consciousness: awake, alert , oriented and patient cooperative  Airway & Oxygen Therapy: Patient Spontanous Breathing and Patient connected to face mask oxygen  Post-op Assessment: Report given to PACU RN and Post -op Vital signs reviewed and stable  Post vital signs: Reviewed and stable  Complications: No apparent anesthesia complications

## 2012-07-29 ENCOUNTER — Encounter (HOSPITAL_COMMUNITY): Payer: Self-pay | Admitting: Urology

## 2012-07-29 NOTE — Anesthesia Postprocedure Evaluation (Signed)
  Anesthesia Post-op Note  Patient: Julie [name]  Procedure(s) Performed: Procedure(s) (LRB): CYSTOSCOPY WITH LEFT RETROGRADE PYELOGRAM, LEFT URETEROSCOPY AND STENT PLACEMENT (Left) HOLMIUM LASER APPLICATION (Left)  Patient Location: PACU  Anesthesia Type: General  Level of Consciousness: awake and alert   Airway and Oxygen Therapy: Patient Spontanous Breathing  Post-op Pain: mild  Post-op Assessment: Post-op Vital signs reviewed, Patient's Cardiovascular Status Stable, Respiratory Function Stable, Patent Airway and No signs of Nausea or vomiting  Last Vitals:  Filed Vitals:   07/28/12 1710  BP: 149/77  Pulse: 81  Temp:   Resp: 16    Post-op Vital Signs: stable   Complications: No apparent anesthesia complications

## 2012-08-13 ENCOUNTER — Other Ambulatory Visit: Payer: Self-pay | Admitting: *Deleted

## 2012-08-13 DIAGNOSIS — D0590 Unspecified type of carcinoma in situ of unspecified breast: Secondary | ICD-10-CM

## 2012-08-13 MED ORDER — ANASTROZOLE 1 MG PO TABS: 1.0000 mg | ORAL_TABLET | Freq: Every day | ORAL | Status: DC

## 2012-08-16 ENCOUNTER — Other Ambulatory Visit (HOSPITAL_COMMUNITY): Payer: Self-pay | Admitting: Urology

## 2012-08-16 DIAGNOSIS — N281 Cyst of kidney, acquired: Secondary | ICD-10-CM

## 2012-08-20 ENCOUNTER — Ambulatory Visit (HOSPITAL_COMMUNITY)
Admission: RE | Admit: 2012-08-20 | Discharge: 2012-08-20 | Disposition: A | Discharge status: Home / Self Care | Payer: Medicare Other | Source: Ambulatory Visit | Attending: Urology | Admitting: Urology

## 2012-08-20 DIAGNOSIS — N139 Obstructive and reflux uropathy, unspecified: Secondary | ICD-10-CM | POA: Insufficient documentation

## 2012-08-20 DIAGNOSIS — Q619 Cystic kidney disease, unspecified: Secondary | ICD-10-CM | POA: Insufficient documentation

## 2012-08-20 DIAGNOSIS — K7689 Other specified diseases of liver: Secondary | ICD-10-CM | POA: Insufficient documentation

## 2012-08-20 DIAGNOSIS — N281 Cyst of kidney, acquired: Secondary | ICD-10-CM

## 2012-08-20 MED ORDER — GADOBENATE DIMEGLUMINE 529 MG/ML IV SOLN
20.0000 mL | Freq: Once | INTRAVENOUS | Status: AC | PRN
  Administered 2012-08-20: 16 mL via INTRAVENOUS

## 2012-09-07 ENCOUNTER — Encounter (INDEPENDENT_AMBULATORY_CARE_PROVIDER_SITE_OTHER): Payer: Self-pay | Admitting: General Surgery

## 2012-09-07 ENCOUNTER — Ambulatory Visit (INDEPENDENT_AMBULATORY_CARE_PROVIDER_SITE_OTHER): Payer: Medicare Other | Admitting: General Surgery

## 2012-09-07 VITALS — BP 140/88 | HR 99 | Wt 171.4 lb

## 2012-09-07 DIAGNOSIS — C50911 Malignant neoplasm of unspecified site of right female breast: Secondary | ICD-10-CM

## 2012-09-07 DIAGNOSIS — C50919 Malignant neoplasm of unspecified site of unspecified female breast: Secondary | ICD-10-CM

## 2012-09-07 NOTE — Patient Instructions (Signed)
Follow up in 1 year.  Mammogram in April  Continue arimedex.

## 2012-09-12 NOTE — Assessment & Plan Note (Signed)
No clinical evidence of disease.   Follow up in 6 months.  Mammogram in April.

## 2012-09-12 NOTE — Progress Notes (Signed)
HISTORY: Patient is a 67 year old female who is now almost 2 years postop from right breast BCT. She had stage I breast cancer. She is continuing on her 5 year course of arimedex.  She is doing well on that.  She has had some urologic issues, and she has been seeing Dr. Berneice Heinrich for this.  She required intervention for urologic obstruction.  She had her mammogram in April and it had no concerning lesions.  She has not had any additional breast concerns, and she has not palpated any masses in her breast.    PERTINENT REVIEW OF SYSTEMS: Otherwise negative.     EXAM: Head: Normocephalic and atraumatic.  Eyes:  Conjunctivae are normal. Pupils are equal, round, and reactive to light. No scleral icterus.  Neck:  Normal range of motion. Neck supple. No tracheal deviation present. No thyromegaly present.  Resp: No respiratory distress, normal effort. Normal breath sounds.   Breast:  R breast remains slightly smaller post operatively than left breast.  Less sore than previously.  Continues to have some thickening at scar.   No masses, no lymphadenopathy.  No nipple retraction or skin dimpling.   CV:  RR&R, no murmurs.   Abd:  Abdomen is soft, non distended and non tender. No masses are palpable.  There is no rebound and no guarding.  Neurological: Alert and oriented to person, place, and time. Coordination normal. R hand with good strength  Skin: Skin is warm and dry. No rash noted. No diaphoretic. No erythema. No pallor.  Psychiatric: Normal mood and affect. Normal behavior. Judgment and thought content normal.      ASSESSMENT AND PLAN:   R breast cancer, pT1bN0M0 ER/PR+, her 2neg, ki67 10%, s/p BCT 05/16/2010 No clinical evidence of disease.   Follow up in 6 months.  Mammogram in April.      Follow up with handwriting issue with PCP   Maudry Diego, MD Surgical Oncology, General & Endocrine Surgery [facility]  WILLARD,JENNIFER, PA-C Carilyn Goodpasture, PA-C

## 2012-10-11 ENCOUNTER — Telehealth: Payer: Self-pay | Admitting: Oncology

## 2012-10-15 ENCOUNTER — Ambulatory Visit: Payer: Medicare Other | Admitting: Oncology

## 2012-10-15 ENCOUNTER — Other Ambulatory Visit: Payer: Medicare Other | Admitting: Lab

## 2012-11-08 ENCOUNTER — Encounter: Payer: Self-pay | Admitting: Oncology

## 2012-11-08 ENCOUNTER — Telehealth: Payer: Self-pay | Admitting: *Deleted

## 2012-11-08 ENCOUNTER — Ambulatory Visit (HOSPITAL_BASED_OUTPATIENT_CLINIC_OR_DEPARTMENT_OTHER): Payer: Medicare Other | Admitting: Oncology

## 2012-11-08 ENCOUNTER — Other Ambulatory Visit: Payer: Medicare Other | Admitting: Lab

## 2012-11-08 VITALS — BP 145/78 | HR 91 | Temp 98.2°F | Resp 20 | Ht 63.0 in | Wt 173.3 lb

## 2012-11-08 DIAGNOSIS — C50419 Malignant neoplasm of upper-outer quadrant of unspecified female breast: Secondary | ICD-10-CM

## 2012-11-08 DIAGNOSIS — D0590 Unspecified type of carcinoma in situ of unspecified breast: Secondary | ICD-10-CM

## 2012-11-08 DIAGNOSIS — E559 Vitamin D deficiency, unspecified: Secondary | ICD-10-CM

## 2012-11-08 DIAGNOSIS — Z170 Estrogen receptor positive status [ER+]: Secondary | ICD-10-CM

## 2012-11-08 DIAGNOSIS — C50911 Malignant neoplasm of unspecified site of right female breast: Secondary | ICD-10-CM

## 2012-11-08 MED ORDER — ANASTROZOLE 1 MG PO TABS: 1.0000 mg | ORAL_TABLET | Freq: Every day | ORAL | Status: DC

## 2012-11-08 NOTE — Telephone Encounter (Signed)
appts made and printed...td 

## 2012-11-08 NOTE — Patient Instructions (Signed)
Clinically doing well no evidence of recurrent disease.  Continue taking Arimidex 1 mg daily.  I will see you back in 6 months time.

## 2012-11-08 NOTE — Progress Notes (Signed)
OFFICE PROGRESS NOTE  CCCarilyn Goodpasture, PA-C [address] [place] [address]  DIAGNOSIS: 67 y/o with stage IA with ER positive, PR positive, HER-2/neu negative invasive ductal carcinoma of the right breast  PRIOR THERAPY:  1. Patient was initially diagnosed in April 2012 when she had a screening mammogram that showed a mass in the right breast.  Biopsy on 05/01/10 showed an invasive mammary carcinoma ER 100%, PR 60%, HER-2/neu negative, Ki-67 of 10%.  She had a MRI on 05/08/10 that showed a 6mm nodule in the upper outer quadrant of the right breast.   2. She went on to have a lumpectomy by Dr. Donell Beers on 05/16/10 that demonstrated invasive ductal carcinoma, grade 1, 0.8cm and DCIS.  2 sentinel nodes were biopsied and negative.  She had pathologic stage IA breast cancer.  3.  She underwent radiation therapy with Dr. Michell Heinrich from 06/12/10 to 07/04/10.  4. Around 08/2010 she started daily Arimidex.    CURRENT THERAPY: Arimidex daily   INTERVAL HISTORY: Julie [name] 67 y.o. female returns for follow up.  She is doing well today.  She takes the Arimidex daily and tolerates it very well.  She denies hot flashes, dryness, joint aches, and any further concerns.  Her health maintenance information is updated below.  A 10 point ROS is negative.    MEDICAL HISTORY: Past Medical History  Diagnosis Date  . Nipple discharge     right-all resolved after surgery  . Colon polyp   . Hypertension   . Cancer     4'12-breast rt; lumpectomy,radiation/Dr. Park Breed, oncology  . Skin change 07-21-12    right abdomen LQ-due to past hx. Heparin injections  . Renal colic     ALLERGIES:  has No Known Allergies.  MEDICATIONS:  Current Outpatient Prescriptions  Medication Sig Dispense Refill  . anastrozole (ARIMIDEX) 1 MG tablet Take 1 tablet (1 mg total) by mouth daily.  90 tablet  3  . aspirin EC 81 MG tablet Take 81 mg by mouth daily.      Marland Kitchen atorvastatin (LIPITOR) 10 MG tablet  Take 10 mg by mouth every evening.       . Calcium Carbonate-Vitamin D (CALCIUM + D) 600-200 MG-UNIT TABS Take 1 tablet by mouth daily.       . Cholecalciferol (VITAMIN D-3) 5000 UNITS TABS Take 1 tablet by mouth daily.      Marland Kitchen losartan (COZAAR) 25 MG tablet Take 25 mg by mouth every morning.       No current facility-administered medications for this visit.    SURGICAL HISTORY:  Past Surgical History  Procedure Laterality Date  . Cesarean section    . Parathyroidectomy    . Appendectomy    . Cystoscopy with stent placement Left 07/09/2012    Procedure: CYSTOSCOPY, RETROGRADE PYELOGRAM WITH LEFT STENT PLACEMENT;  Surgeon: Sebastian Ache, MD;  Location: [facility];  Service: Urology;  Laterality: Left;  . Breast surgery Right     lumpectomy  . Cystoscopy with retrograde pyelogram, ureteroscopy and stent placement Left 07/28/2012    Procedure: CYSTOSCOPY WITH LEFT RETROGRADE PYELOGRAM, LEFT URETEROSCOPY AND STENT PLACEMENT;  Surgeon: Sebastian Ache, MD;  Location: [facility];  Service: Urology;  Laterality: Left;  . Holmium laser application Left 07/28/2012    Procedure: HOLMIUM LASER APPLICATION;  Surgeon: Sebastian Ache, MD;  Location: [facility];  Service: Urology;  Laterality: Left;    REVIEW OF SYSTEMS:   General: fatigue (-), night sweats (-), fever (-),  pain (-) Lymph: palpable nodes (-) HEENT: vision changes (-), mucositis (-), gum bleeding (-), epistaxis (-) Cardiovascular: chest pain (-), palpitations (-) Pulmonary: shortness of breath (-), dyspnea on exertion (-), cough (-), hemoptysis (-) GI:  Early satiety (-), melena (-), dysphagia (-), nausea/vomiting (-), diarrhea (-) GU: dysuria (-), hematuria (-), incontinence (-) Musculoskeletal: joint swelling (-), joint pain (-), back pain (-) Neuro: weakness (-), numbness (-), headache (-), confusion (-) Skin: Rash (-), lesions (-), dryness (-) Psych: depression (-), suicidal/homicidal ideation (-), feeling of hopelessness (-)   HEALTH  MAINTENANCE:  Mammogram 04/2011 Colonoscopy 07/2010 Bone Density 04/2010 followed by PCP Pap Smear 2 years ago Eye Exam 1 year ago Vitamin D 78 on 07/2011  Lipid Panel 6 months ago  PHYSICAL EXAMINATION: Blood pressure 145/78, pulse 91, temperature 98.2 F (36.8 C), temperature source Oral, resp. rate 20, height 5\' 3"  (1.6 m), weight 173 lb 4.8 oz (78.608 kg). Body mass index is 30.71 kg/(m^2). General: Patient is a well appearing female in no acute distress HEENT: PERRLA, sclerae anicteric no conjunctival pallor, MMM Neck: supple, no palpable adenopathy Lungs: clear to auscultation bilaterally, no wheezes, rhonchi, or rales Cardiovascular: regular rate rhythm, S1, S2, no murmurs, rubs or gallops Abdomen: Soft, non-tender, non-distended, normoactive bowel sounds, no HSM Extremities: warm and well perfused, no clubbing, cyanosis, or edema Skin: No rashes or lesions Neuro: Non-focal Breasts: right breast lumpectomy site without nodularity, breast without masses or nodules, left breast no masses or nodularity ECOG PERFORMANCE STATUS: 1 - Symptomatic but completely ambulatory    LABORATORY DATA: Lab Results  Component Value Date   WBC 6.8 07/21/2012   HGB 14.0 07/21/2012   HCT 41.9 07/21/2012   MCV 90.7 07/21/2012   PLT 280 07/21/2012      Chemistry      Component Value Date/Time   NA 135 07/10/2012 0350   NA 142 04/12/2012 1349   K 4.5 07/10/2012 0350   K 3.8 04/12/2012 1349   CL 103 07/10/2012 0350   CL 107 04/12/2012 1349   CO2 18* 07/10/2012 0350   CO2 23 04/12/2012 1349   BUN 21 07/10/2012 0350   BUN 16.5 04/12/2012 1349   CREATININE 0.98 07/10/2012 0350   CREATININE 0.9 04/12/2012 1349      Component Value Date/Time   CALCIUM 8.7 07/10/2012 0350   CALCIUM 9.1 04/12/2012 1349   ALKPHOS 110 04/12/2012 1349   ALKPHOS 93 07/17/2011 1356   AST 20 04/12/2012 1349   AST 29 07/17/2011 1356   ALT 18 04/12/2012 1349   ALT 27 07/17/2011 1356   BILITOT 0.97 04/12/2012 1349   BILITOT 0.8 07/17/2011 1356        RADIOGRAPHIC STUDIES:  No results found.  ASSESSMENT:    Patient is a 67 year old with pathologic stage IA ER positive, PR positive, HER-2/neu negative invasive ducal carcinoma of the right breast.  She underwent lumpectomy, radiation therapy and now is on daily Arimidex for almost two years.  We plan to complete 5 years of therapy.     PLAN:  #1 continue Arimidex 1 mg daily. Prescription was sent to the patient's pharmacy.  #2 she will be seen back in 6 months time for followup and at that time she will have mammograms performed.  All questions were answered. The patient knows to call the clinic with any problems, questions or concerns. We can certainly see the patient much sooner if necessary.  I spent 15 minutes counseling the patient face to face.  The total time spent in the appointment was 20 minutes.   Drue Second, MD Medical/Oncology [facility] [phone] (beeper) [phone] (Office)  11/08/2012, 10:31 AM

## 2012-11-09 LAB — VITAMIN D 25 HYDROXY (VIT D DEFICIENCY, FRACTURES): Vit D, 25-Hydroxy: 81 ng/mL (ref 30–89)

## 2013-05-06 ENCOUNTER — Telehealth: Payer: Self-pay | Admitting: Oncology

## 2013-05-06 NOTE — Telephone Encounter (Signed)
, °

## 2013-05-13 ENCOUNTER — Other Ambulatory Visit: Payer: Medicare Other

## 2013-05-13 ENCOUNTER — Ambulatory Visit: Payer: Medicare Other | Admitting: Oncology

## 2013-06-08 ENCOUNTER — Ambulatory Visit: Payer: Self-pay | Admitting: Internal Medicine

## 2013-06-08 ENCOUNTER — Other Ambulatory Visit: Payer: Medicare Other

## 2013-06-09 ENCOUNTER — Telehealth: Payer: Self-pay | Admitting: Oncology

## 2013-06-09 NOTE — Telephone Encounter (Signed)
, °

## 2013-06-14 ENCOUNTER — Other Ambulatory Visit (HOSPITAL_BASED_OUTPATIENT_CLINIC_OR_DEPARTMENT_OTHER): Payer: Medicare Other

## 2013-06-14 ENCOUNTER — Ambulatory Visit (HOSPITAL_BASED_OUTPATIENT_CLINIC_OR_DEPARTMENT_OTHER): Payer: Medicare Other | Admitting: Oncology

## 2013-06-14 ENCOUNTER — Telehealth: Payer: Self-pay | Admitting: Oncology

## 2013-06-14 ENCOUNTER — Encounter: Payer: Self-pay | Admitting: Oncology

## 2013-06-14 VITALS — BP 129/63 | HR 81 | Temp 98.1°F | Resp 18 | Ht 63.0 in | Wt 172.1 lb

## 2013-06-14 DIAGNOSIS — C50911 Malignant neoplasm of unspecified site of right female breast: Secondary | ICD-10-CM

## 2013-06-14 DIAGNOSIS — Z170 Estrogen receptor positive status [ER+]: Secondary | ICD-10-CM

## 2013-06-14 DIAGNOSIS — C50419 Malignant neoplasm of upper-outer quadrant of unspecified female breast: Secondary | ICD-10-CM

## 2013-06-14 DIAGNOSIS — Z79811 Long term (current) use of aromatase inhibitors: Secondary | ICD-10-CM

## 2013-06-14 LAB — CBC WITH DIFFERENTIAL/PLATELET
BASO%: 1.1 % (ref 0.0–2.0)
BASOS ABS: 0.1 10*3/uL (ref 0.0–0.1)
EOS ABS: 0.1 10*3/uL (ref 0.0–0.5)
EOS%: 2 % (ref 0.0–7.0)
HEMATOCRIT: 44.2 % (ref 34.8–46.6)
HEMOGLOBIN: 14.7 g/dL (ref 11.6–15.9)
LYMPH%: 25.6 % (ref 14.0–49.7)
MCH: 29.6 pg (ref 25.1–34.0)
MCHC: 33.4 g/dL (ref 31.5–36.0)
MCV: 88.8 fL (ref 79.5–101.0)
MONO#: 0.6 10*3/uL (ref 0.1–0.9)
MONO%: 8.6 % (ref 0.0–14.0)
NEUT%: 62.7 % (ref 38.4–76.8)
NEUTROS ABS: 4.3 10*3/uL (ref 1.5–6.5)
PLATELETS: 218 10*3/uL (ref 145–400)
RBC: 4.97 10*6/uL (ref 3.70–5.45)
RDW: 13.9 % (ref 11.2–14.5)
WBC: 6.8 10*3/uL (ref 3.9–10.3)
lymph#: 1.7 10*3/uL (ref 0.9–3.3)

## 2013-06-14 LAB — COMPREHENSIVE METABOLIC PANEL (CC13)
ALK PHOS: 138 U/L (ref 40–150)
ALT: 23 U/L (ref 0–55)
ANION GAP: 10 meq/L (ref 3–11)
AST: 27 U/L (ref 5–34)
Albumin: 4.2 g/dL (ref 3.5–5.0)
BILIRUBIN TOTAL: 1.26 mg/dL — AB (ref 0.20–1.20)
BUN: 14.2 mg/dL (ref 7.0–26.0)
CO2: 25 mEq/L (ref 22–29)
Calcium: 9.5 mg/dL (ref 8.4–10.4)
Chloride: 108 mEq/L (ref 98–109)
Creatinine: 0.9 mg/dL (ref 0.6–1.1)
GLUCOSE: 92 mg/dL (ref 70–140)
Potassium: 4.2 mEq/L (ref 3.5–5.1)
Sodium: 142 mEq/L (ref 136–145)
Total Protein: 7.2 g/dL (ref 6.4–8.3)

## 2013-06-14 NOTE — Progress Notes (Signed)
Julie [name]  Telephone:([phone]) [phone] Fax:(336) [phone]     ID: GIAVANNA KANG OB: 1945/09/19  MR#: [id number]  XNA#:[id number]  Patient Care Team: Carlos Levering, PA-C as PCP - General (Family Medicine)  CHIEF COMPLAINT:  Chief Complaint  Patient presents with  . Breast Cancer  . Follow-up    BREAST CANCER HISTORY: 68 y/o with stage IA with ER positive, PR positive, HER-2/neu negative invasive ductal carcinoma of the right breast  PRIOR THERAPY:   1. Patient was initially diagnosed in April 2012 when she had a screening mammogram that showed a mass in the right breast.  Biopsy on 05/01/10 showed an invasive mammary carcinoma ER 100%, PR 60%, HER-2/neu negative, Ki-67 of 10%.  She had a MRI on 05/08/10 that showed a 60m nodule in the upper outer quadrant of the right breast.   2. She went on to have a lumpectomy by Dr. BBarry Dieneson 05/16/10 that demonstrated invasive ductal carcinoma, grade 1, 0.8cm and DCIS.  2 sentinel nodes were biopsied and negative.  She had pathologic stage IA breast cancer.  3.  She underwent radiation therapy with Dr. WPablo Ledgerfrom 06/12/10 to 07/04/10.  4. Around 08/2010 she started daily Arimidex.     INTERVAL HISTORY: Patient returns to clinic today for routine 6 month evaluation. She is slightly anxious based on her recent mammogram which showed some new calcifications. She otherwise feels well and is asymptomatic. She is tolerating Arimidex without significant side effects. She has no recent fevers or illnesses. She has a good appetite and denies weight loss. She has no chest pain or shortness of breath. She denies any nausea, vomiting, constipation, or diarrhea. She has no urinary complaints. Patient otherwise feels well and offers no further specific complaints today.  REVIEW OF SYSTEMS:  As per HPI. Otherwise, 10 point system review was negative.  PAST MEDICAL HISTORY: Past Medical History  Diagnosis Date  . Nipple discharge     right-all  resolved after surgery  . Colon polyp   . Hypertension   . Cancer     4'12-breast rt; lumpectomy,radiation/Dr. KChancy Milroy oncology  . Skin change 07-21-12    right abdomen LQ-due to past hx. Heparin injections  . Renal colic     PAST SURGICAL HISTORY: Past Surgical History  Procedure Laterality Date  . Cesarean section    . Parathyroidectomy    . Appendectomy    . Cystoscopy with stent placement Left 07/09/2012    Procedure: CYSTOSCOPY, RETROGRADE PYELOGRAM WITH LEFT STENT PLACEMENT;  Surgeon: TAlexis Frock MD;  Location: [facility];  Service: Urology;  Laterality: Left;  . Breast surgery Right     lumpectomy  . Cystoscopy with retrograde pyelogram, ureteroscopy and stent placement Left 07/28/2012    Procedure: CYSTOSCOPY WITH LEFT RETROGRADE PYELOGRAM, LEFT URETEROSCOPY AND STENT PLACEMENT;  Surgeon: TAlexis Frock MD;  Location: [facility];  Service: Urology;  Laterality: Left;  . Holmium laser application Left 75/42/7062   Procedure: HOLMIUM LASER APPLICATION;  Surgeon: TAlexis Frock MD;  Location: [facility];  Service: Urology;  Laterality: Left;    FAMILY HISTORY Family History  Problem Relation Age of Onset  . Hypertension Mother   . Hyperlipidemia Mother   . Hypertension Father   . Hyperlipidemia Father     GYNECOLOGIC HISTORY:  No LMP recorded. Patient is postmenopausal.      ADVANCED DIRECTIVES:    HEALTH MAINTENANCE: History  Substance Use Topics  . Smoking status: Former Smoker    Quit date: 07/21/1968  .  Smokeless tobacco: Not on file  . Alcohol Use: No     Colonoscopy:  PAP:  Bone density:  Lipid panel:  No Known Allergies  Current Outpatient Prescriptions  Medication Sig Dispense Refill  . anastrozole (ARIMIDEX) 1 MG tablet Take 1 tablet (1 mg total) by mouth daily.  90 tablet  6  . aspirin EC 81 MG tablet Take 81 mg by mouth daily.      Marland Kitchen atorvastatin (LIPITOR) 10 MG tablet Take 10 mg by mouth every evening.       . Calcium Carbonate-Vitamin D (CALCIUM +  D) 600-200 MG-UNIT TABS Take 1 tablet by mouth daily.       . Cholecalciferol (VITAMIN D-3) 5000 UNITS TABS Take 1 tablet by mouth daily.      Marland Kitchen losartan (COZAAR) 25 MG tablet Take 25 mg by mouth every morning.       No current facility-administered medications for this visit.    OBJECTIVE: Filed Vitals:   06/14/13 1112  BP: 129/63  Pulse: 81  Temp: 98.1 F (36.7 C)  Resp: 18     Body mass index is 30.49 kg/(m^2).    ECOG FS:0 - Asymptomatic  General: Well-developed, well-nourished, no acute distress. Eyes: Pink conjunctiva, anicteric sclera. Breasts: Bilateral breast and axilla without lumps or masses. Lungs: Clear to auscultation bilaterally. Heart: Regular rate and rhythm. No rubs, murmurs, or gallops. Abdomen: Soft, nontender, nondistended. No organomegaly noted, normoactive bowel sounds. Musculoskeletal: No edema, cyanosis, or clubbing. Neuro: Alert, answering all questions appropriately. Cranial nerves grossly intact. Skin: No rashes or petechiae noted. Psych: Normal affect.    LAB RESULTS:  CMP     Component Value Date/Time   NA 142 06/14/2013 1102   NA 135 07/10/2012 0350   K 4.2 06/14/2013 1102   K 4.5 07/10/2012 0350   CL 103 07/10/2012 0350   CL 107 04/12/2012 1349   CO2 25 06/14/2013 1102   CO2 18* 07/10/2012 0350   GLUCOSE 92 06/14/2013 1102   GLUCOSE 250* 07/10/2012 0350   GLUCOSE 134* 04/12/2012 1349   BUN 14.2 06/14/2013 1102   BUN 21 07/10/2012 0350   CREATININE 0.9 06/14/2013 1102   CREATININE 0.98 07/10/2012 0350   CALCIUM 9.5 06/14/2013 1102   CALCIUM 8.7 07/10/2012 0350   PROT 7.2 06/14/2013 1102   PROT 6.6 07/17/2011 1356   ALBUMIN 4.2 06/14/2013 1102   ALBUMIN 4.2 07/17/2011 1356   AST 27 06/14/2013 1102   AST 29 07/17/2011 1356   ALT 23 06/14/2013 1102   ALT 27 07/17/2011 1356   ALKPHOS 138 06/14/2013 1102   ALKPHOS 93 07/17/2011 1356   BILITOT 1.26* 06/14/2013 1102   BILITOT 0.8 07/17/2011 1356   GFRNONAA 58* 07/10/2012 0350   GFRAA 68* 07/10/2012 0350    I No results found for  this basename: SPEP, UPEP,  kappa and lambda light chains    Lab Results  Component Value Date   WBC 6.8 06/14/2013   NEUTROABS 4.3 06/14/2013   HGB 14.7 06/14/2013   HCT 44.2 06/14/2013   MCV 88.8 06/14/2013   PLT 218 06/14/2013      Chemistry      Component Value Date/Time   NA 142 06/14/2013 1102   NA 135 07/10/2012 0350   K 4.2 06/14/2013 1102   K 4.5 07/10/2012 0350   CL 103 07/10/2012 0350   CL 107 04/12/2012 1349   CO2 25 06/14/2013 1102   CO2 18* 07/10/2012 0350   BUN 14.2 06/14/2013 1102  BUN 21 07/10/2012 0350   CREATININE 0.9 06/14/2013 1102   CREATININE 0.98 07/10/2012 0350      Component Value Date/Time   CALCIUM 9.5 06/14/2013 1102   CALCIUM 8.7 07/10/2012 0350   ALKPHOS 138 06/14/2013 1102   ALKPHOS 93 07/17/2011 1356   AST 27 06/14/2013 1102   AST 29 07/17/2011 1356   ALT 23 06/14/2013 1102   ALT 27 07/17/2011 1356   BILITOT 1.26* 06/14/2013 1102   BILITOT 0.8 07/17/2011 1356       Lab Results  Component Value Date   LABCA2 13 05/15/2010    No components found with this basename: LABCA125    No results found for this basename: INR,  in the last 168 hours  Urinalysis    Component Value Date/Time   COLORURINE RED* 07/09/2012 1224   APPEARANCEUR TURBID* 07/09/2012 1224   LABSPEC 1.022 07/09/2012 1224   PHURINE 5.5 07/09/2012 1224   GLUCOSEU NEGATIVE 07/09/2012 1224   HGBUR LARGE* 07/09/2012 1224   BILIRUBINUR LARGE* 07/09/2012 1224   KETONESUR 15* 07/09/2012 1224   PROTEINUR >300* 07/09/2012 1224   UROBILINOGEN 1.0 07/09/2012 1224   NITRITE POSITIVE* 07/09/2012 1224   LEUKOCYTESUR LARGE* 07/09/2012 1224    STUDIES: No results found.  ASSESSMENT:    Patient is a 68 year old with pathologic stage IA ER positive, PR positive, HER-2/neu negative invasive ducal carcinoma of the right breast.  She underwent lumpectomy, radiation therapy and now is on daily Arimidex for almost two years.  We plan to complete 5 years of therapy.     PLAN:  #1 continue Arimidex 1 mg daily, completing in approximately August  of 2017. Her most recent mammogram was reported as BI-RADS 3, therefore will repeat in 6 months to assess for interval change. Return to clinic in 6 months 1-2 days after her mammogram for further evaluation and discussion of the results. Patient understands she can return to clinic at any time if she has any questions, concerns, or complaints.   Lloyd Huger, MD   06/14/2013 3:49 PM

## 2013-06-14 NOTE — Telephone Encounter (Signed)
, °

## 2013-08-25 ENCOUNTER — Encounter (INDEPENDENT_AMBULATORY_CARE_PROVIDER_SITE_OTHER): Payer: Self-pay | Admitting: General Surgery

## 2013-08-26 ENCOUNTER — Other Ambulatory Visit: Payer: Self-pay | Admitting: Urology

## 2013-08-26 DIAGNOSIS — N281 Cyst of kidney, acquired: Secondary | ICD-10-CM

## 2013-09-14 ENCOUNTER — Ambulatory Visit (HOSPITAL_COMMUNITY)
Admission: RE | Admit: 2013-09-14 | Discharge: 2013-09-14 | Disposition: A | Discharge status: Home / Self Care | Payer: Medicare Other | Source: Ambulatory Visit | Attending: Diagnostic Radiology | Admitting: Diagnostic Radiology

## 2013-09-14 DIAGNOSIS — Q618 Other cystic kidney diseases: Secondary | ICD-10-CM | POA: Diagnosis not present

## 2013-09-14 DIAGNOSIS — N281 Cyst of kidney, acquired: Secondary | ICD-10-CM

## 2013-09-14 DIAGNOSIS — K668 Other specified disorders of peritoneum: Secondary | ICD-10-CM | POA: Insufficient documentation

## 2013-09-14 LAB — POCT I-STAT CREATININE: Creatinine, Ser: 0.7 mg/dL (ref 0.50–1.10)

## 2013-09-14 MED ORDER — GADOBENATE DIMEGLUMINE 529 MG/ML IV SOLN
20.0000 mL | Freq: Once | INTRAVENOUS | Status: AC | PRN
  Administered 2013-09-14: 16 mL via INTRAVENOUS

## 2013-10-03 ENCOUNTER — Ambulatory Visit (INDEPENDENT_AMBULATORY_CARE_PROVIDER_SITE_OTHER): Payer: Medicare Other | Admitting: General Surgery

## 2013-11-16 ENCOUNTER — Other Ambulatory Visit: Payer: Self-pay | Admitting: *Deleted

## 2013-11-22 ENCOUNTER — Telehealth: Payer: Self-pay

## 2013-11-22 NOTE — Telephone Encounter (Signed)
Order faxed to [facility] for mammogram.  Sent to scan.   

## 2013-12-02 ENCOUNTER — Other Ambulatory Visit: Payer: Self-pay

## 2013-12-02 DIAGNOSIS — C50911 Malignant neoplasm of unspecified site of right female breast: Secondary | ICD-10-CM

## 2013-12-05 ENCOUNTER — Other Ambulatory Visit (HOSPITAL_BASED_OUTPATIENT_CLINIC_OR_DEPARTMENT_OTHER): Payer: Medicare Other

## 2013-12-05 ENCOUNTER — Ambulatory Visit (HOSPITAL_BASED_OUTPATIENT_CLINIC_OR_DEPARTMENT_OTHER): Payer: Medicare Other | Admitting: Hematology and Oncology

## 2013-12-05 ENCOUNTER — Telehealth: Payer: Self-pay | Admitting: Hematology and Oncology

## 2013-12-05 VITALS — BP 141/68 | HR 88 | Temp 98.2°F | Resp 18 | Ht 63.0 in | Wt 172.8 lb

## 2013-12-05 DIAGNOSIS — D0590 Unspecified type of carcinoma in situ of unspecified breast: Secondary | ICD-10-CM

## 2013-12-05 DIAGNOSIS — C50911 Malignant neoplasm of unspecified site of right female breast: Secondary | ICD-10-CM

## 2013-12-05 DIAGNOSIS — C50311 Malignant neoplasm of lower-inner quadrant of right female breast: Secondary | ICD-10-CM

## 2013-12-05 DIAGNOSIS — Z170 Estrogen receptor positive status [ER+]: Secondary | ICD-10-CM

## 2013-12-05 LAB — COMPREHENSIVE METABOLIC PANEL (CC13)
ALT: 24 U/L (ref 0–55)
ANION GAP: 10 meq/L (ref 3–11)
AST: 28 U/L (ref 5–34)
Albumin: 4.2 g/dL (ref 3.5–5.0)
Alkaline Phosphatase: 120 U/L (ref 40–150)
BILIRUBIN TOTAL: 1.22 mg/dL — AB (ref 0.20–1.20)
BUN: 13.9 mg/dL (ref 7.0–26.0)
CALCIUM: 9.8 mg/dL (ref 8.4–10.4)
CO2: 24 meq/L (ref 22–29)
Chloride: 108 mEq/L (ref 98–109)
Creatinine: 0.8 mg/dL (ref 0.6–1.1)
GLUCOSE: 110 mg/dL (ref 70–140)
Potassium: 4.1 mEq/L (ref 3.5–5.1)
SODIUM: 142 meq/L (ref 136–145)
TOTAL PROTEIN: 6.8 g/dL (ref 6.4–8.3)

## 2013-12-05 LAB — CBC WITH DIFFERENTIAL/PLATELET
BASO%: 0.5 % (ref 0.0–2.0)
Basophils Absolute: 0 10*3/uL (ref 0.0–0.1)
EOS ABS: 0.3 10*3/uL (ref 0.0–0.5)
EOS%: 4 % (ref 0.0–7.0)
HEMATOCRIT: 42.9 % (ref 34.8–46.6)
HGB: 14.1 g/dL (ref 11.6–15.9)
LYMPH%: 29.3 % (ref 14.0–49.7)
MCH: 29.3 pg (ref 25.1–34.0)
MCHC: 32.9 g/dL (ref 31.5–36.0)
MCV: 89 fL (ref 79.5–101.0)
MONO#: 0.5 10*3/uL (ref 0.1–0.9)
MONO%: 7.4 % (ref 0.0–14.0)
NEUT%: 58.8 % (ref 38.4–76.8)
NEUTROS ABS: 3.7 10*3/uL (ref 1.5–6.5)
PLATELETS: 193 10*3/uL (ref 145–400)
RBC: 4.82 10*6/uL (ref 3.70–5.45)
RDW: 14 % (ref 11.2–14.5)
WBC: 6.3 10*3/uL (ref 3.9–10.3)
lymph#: 1.8 10*3/uL (ref 0.9–3.3)

## 2013-12-05 MED ORDER — ANASTROZOLE 1 MG PO TABS: 1.0000 mg | ORAL_TABLET | Freq: Every day | ORAL | Status: DC

## 2013-12-05 NOTE — Assessment & Plan Note (Addendum)
Right breast invasive ductal carcinoma grade 1, 0.8 cm and DCIS, 2 SLN negative, T1 B. N0 M0 stage IA, status post lumpectomy and radiation and is currently on Arimidex started August 2012  Arimidex related toxicities: patient does not have any side effects to Arimidex. Plan of treatment is 5 years  Surveillance: Today's breast exam was normal. I reviewed the mammograms done on April 2015 which showed a new calcification that led to a repeat mammogram recently at [facility] which I do not have the result which was apparently benign. She'll continue to have annual mammograms on both breasts  Survivorship:Discussed the importance of physical exercise in decreasing the likelihood of breast cancer recurrence. Recommended 30 mins daily 6 days a week of either brisk walking or cycling or swimming. Encouraged patient to eat more fruits and vegetables and decrease red meat.

## 2013-12-05 NOTE — Telephone Encounter (Signed)
, °

## 2013-12-05 NOTE — Progress Notes (Signed)
Patient Care Team: Carlos Levering, PA-C as PCP - General (Family Medicine)  DIAGNOSIS: No matching staging information was found for the patient.  SUMMARY OF ONCOLOGIC HISTORY:   Breast cancer of lower-inner quadrant of right female breast   05/01/2010 Initial Diagnosis Right breast mass: Invasive mammary cancer ER 100% PR 60% HER-2 negative Ki-67 10% MRI showed a 6 mm nodule upper-outer quadrant right breast   05/16/2010 Surgery Right breast lumpectomy: Invasive ductal carcinoma grade 1, 0.8 cm and DCIS, 2 SLN negative T1 B. N0 M0 stage IA   06/12/2010 - 12/04/2010 Radiation Therapy Adjuvant radiation therapy   09/05/2010 -  Anti-estrogen oral therapy Arimidex 1 mg daily plan is for 5 years    CHIEF COMPLIANT: Followup of breast cancer  INTERVAL HISTORY: Julie [name] is a 68 year old Caucasian with above-mentioned history of right-sided breast cancer treated with lumpectomy and adjuvant radiation therapy and has been on Arimidex since August 2012. She's been tolerating Arimidex extremely well without any major problems or concerns. Denies any lumps or nodules in the breast and recently had a mammogram that was normal.previous mammograms showed benign calcification which was followed up with another mammogram.  REVIEW OF SYSTEMS:   Constitutional: Denies fevers, chills or abnormal weight loss Eyes: Denies blurriness of vision Ears, nose, mouth, throat, and face: Denies mucositis or sore throat Respiratory: Denies cough, dyspnea or wheezes Cardiovascular: Denies palpitation, chest discomfort or lower extremity swelling Gastrointestinal:  Denies nausea, heartburn or change in bowel habits Skin: Denies abnormal skin rashes Lymphatics: Denies new lymphadenopathy or easy bruising Neurological:Denies numbness, tingling or new weaknesses Behavioral/Psych: Mood is stable, no new changes  Breast:  denies any pain or lumps or nodules in either breasts All other systems were reviewed with the  patient and are negative.  I have reviewed the past medical history, past surgical history, social history and family history with the patient and they are unchanged from previous note.  ALLERGIES:  has No Known Allergies.  MEDICATIONS:  Current Outpatient Prescriptions  Medication Sig Dispense Refill  . anastrozole (ARIMIDEX) 1 MG tablet Take 1 tablet (1 mg total) by mouth daily. 90 tablet 6  . aspirin EC 81 MG tablet Take 81 mg by mouth daily.    Marland Kitchen atorvastatin (LIPITOR) 10 MG tablet Take 10 mg by mouth every evening.     . Calcium Carbonate-Vitamin D (CALCIUM + D) 600-200 MG-UNIT TABS Take 1 tablet by mouth daily.     . Cholecalciferol (VITAMIN D-3) 5000 UNITS TABS Take 1 tablet by mouth daily.    Marland Kitchen losartan (COZAAR) 25 MG tablet Take 25 mg by mouth every morning.     No current facility-administered medications for this visit.    PHYSICAL EXAMINATION: ECOG PERFORMANCE STATUS: 0 - Asymptomatic  Filed Vitals:   12/05/13 1115  BP: 141/68  Pulse: 88  Temp: 98.2 F (36.8 C)  Resp: 18   Filed Weights   12/05/13 1115  Weight: 172 lb 12.8 oz (78.382 kg)    GENERAL:alert, no distress and comfortable SKIN: skin color, texture, turgor are normal, no rashes or significant lesions EYES: normal, Conjunctiva are pink and non-injected, sclera clear OROPHARYNX:no exudate, no erythema and lips, buccal mucosa, and tongue normal  NECK: supple, thyroid normal size, non-tender, without nodularity LYMPH:  no palpable lymphadenopathy in the cervical, axillary or inguinal LUNGS: clear to auscultation and percussion with normal breathing effort HEART: regular rate & rhythm and no murmurs and no lower extremity edema ABDOMEN:abdomen soft, non-tender and normal bowel sounds  Musculoskeletal:no cyanosis of digits and no clubbing  NEURO: alert & oriented x 3 with fluent speech, no focal motor/sensory deficits BREAST: No palpable masses or nodules in either right or left breasts. No palpable  axillary supraclavicular or infraclavicular adenopathy no breast tenderness or nipple discharge.   LABORATORY DATA:  I have reviewed the data as listed   Chemistry      Component Value Date/Time   NA 142 12/05/2013 1041   NA 135 07/10/2012 0350   K 4.1 12/05/2013 1041   K 4.5 07/10/2012 0350   CL 103 07/10/2012 0350   CL 107 04/12/2012 1349   CO2 24 12/05/2013 1041   CO2 18* 07/10/2012 0350   BUN 13.9 12/05/2013 1041   BUN 21 07/10/2012 0350   CREATININE 0.8 12/05/2013 1041   CREATININE 0.70 09/14/2013 0839      Component Value Date/Time   CALCIUM 9.8 12/05/2013 1041   CALCIUM 8.7 07/10/2012 0350   ALKPHOS 120 12/05/2013 1041   ALKPHOS 93 07/17/2011 1356   AST 28 12/05/2013 1041   AST 29 07/17/2011 1356   ALT 24 12/05/2013 1041   ALT 27 07/17/2011 1356   BILITOT 1.22* 12/05/2013 1041   BILITOT 0.8 07/17/2011 1356       Lab Results  Component Value Date   WBC 6.3 12/05/2013   HGB 14.1 12/05/2013   HCT 42.9 12/05/2013   MCV 89.0 12/05/2013   PLT 193 12/05/2013   NEUTROABS 3.7 12/05/2013     RADIOGRAPHIC STUDIES: Mammograms done November 2015 normal  ASSESSMENT & PLAN:  Breast cancer of lower-inner quadrant of right female breast Right breast invasive ductal carcinoma grade 1, 0.8 cm and DCIS, 2 SLN negative, T1 B. N0 M0 stage IA, status post lumpectomy and radiation and is currently on Arimidex started August 2012  Arimidex related toxicities: patient does not have any side effects to Arimidex. Plan of treatment is 5 years  Surveillance: Today's breast exam was normal. I reviewed the mammograms done on April 2015 which showed a new calcification that led to a repeat mammogram recently at [facility] which I do not have the result which was apparently benign. She'll continue to have annual mammograms on both breasts  Survivorship:Discussed the importance of physical exercise in decreasing the likelihood of breast cancer recurrence. Recommended 30 mins daily 6 days a  week of either brisk walking or cycling or swimming. Encouraged patient to eat more fruits and vegetables and decrease red meat.    Orders Placed This Encounter  Procedures  . CBC with Differential    Standing Status: Future     Number of Occurrences:      Standing Expiration Date: 12/05/2014  . Comprehensive metabolic panel (Cmet) - [facility]    Standing Status: Future     Number of Occurrences:      Standing Expiration Date: 12/05/2014   The patient has a good understanding of the overall plan. she agrees with it. She will call with any problems that may develop before her next visit here.   Rulon Eisenmenger, MD 12/05/2013 12:15 PM

## 2013-12-05 NOTE — Addendum Note (Signed)
Addended by: Prentiss Bells on: 12/05/2013 05:53 PM   Modules accepted: Orders

## 2013-12-05 NOTE — Progress Notes (Signed)
Pt called in at 351 - needs arimidex refilled.  Let pt know I would send refill before the end of the day.  Pt voiced understanding.   Refill sent to [facility] - receipt confirmed.

## 2013-12-08 ENCOUNTER — Telehealth: Payer: Self-pay

## 2013-12-08 NOTE — Telephone Encounter (Signed)
Mammogram Results received from [facility] 11/23/13 Dr. Marcelo Baldy.  Copy to Dr Lindi Adie. Original to scan

## 2014-06-06 ENCOUNTER — Telehealth: Payer: Self-pay

## 2014-06-06 NOTE — Telephone Encounter (Signed)
Mammogram results dtd 05/24/14 rcvd from [place].  Reviewed by Dr. Lindi Adie.  Sent to scan.

## 2014-06-20 ENCOUNTER — Other Ambulatory Visit (HOSPITAL_COMMUNITY): Payer: Self-pay | Admitting: Respiratory Therapy

## 2014-06-20 DIAGNOSIS — R569 Unspecified convulsions: Secondary | ICD-10-CM

## 2014-06-21 ENCOUNTER — Other Ambulatory Visit: Payer: Self-pay | Admitting: Family Medicine

## 2014-06-21 DIAGNOSIS — R42 Dizziness and giddiness: Secondary | ICD-10-CM

## 2014-07-03 ENCOUNTER — Other Ambulatory Visit: Payer: Self-pay

## 2014-07-03 ENCOUNTER — Ambulatory Visit
Admission: RE | Admit: 2014-07-03 | Discharge: 2014-07-03 | Disposition: A | Discharge status: Home / Self Care | Payer: Medicare Other | Source: Ambulatory Visit | Attending: Family Medicine | Admitting: Family Medicine

## 2014-07-03 DIAGNOSIS — R42 Dizziness and giddiness: Secondary | ICD-10-CM

## 2014-07-03 MED ORDER — GADOBENATE DIMEGLUMINE 529 MG/ML IV SOLN
15.0000 mL | Freq: Once | INTRAVENOUS | Status: AC | PRN
  Administered 2014-07-03: 15 mL via INTRAVENOUS

## 2014-07-04 ENCOUNTER — Other Ambulatory Visit: Payer: Self-pay | Admitting: Family Medicine

## 2014-07-04 DIAGNOSIS — R930 Abnormal findings on diagnostic imaging of skull and head, not elsewhere classified: Secondary | ICD-10-CM

## 2014-07-05 ENCOUNTER — Ambulatory Visit (HOSPITAL_COMMUNITY)
Admission: RE | Admit: 2014-07-05 | Discharge: 2014-07-05 | Disposition: A | Discharge status: Home / Self Care | Payer: Medicare Other | Source: Ambulatory Visit | Attending: Family Medicine | Admitting: Family Medicine

## 2014-07-05 DIAGNOSIS — R569 Unspecified convulsions: Secondary | ICD-10-CM | POA: Diagnosis present

## 2014-07-05 NOTE — Progress Notes (Signed)
EEG completed as OP.  Results pending.

## 2014-07-05 NOTE — Procedures (Addendum)
History: 69 YO F presenting for evaluation of 3 episodes within 24 hrs about 2 weeks ago; symptoms of unresponsiveness, vertigo, nausea and observed seizure like activity, leaning to right, wringing her hands, looking off in the distance. These episodes lasted only a few seconds. She has a history of breast cancer four years ago.  Sedation: None  Technique: This is a 19 channel routine scalp EEG performed at the bedside with bipolar and monopolar montages arranged in accordance to the international 10/20 system of electrode placement. One channel was dedicated to EKG recording.    Background: The background consists of intermixed alpha and beta activities. There is a well defined posterior dominant rhythm of 9.5 Hz that attenuates with eye opening. There is a wicket rhythm present.  Sleep is recorded with normal appearing structures.   Photic stimulation: Physiologic driving is present  EEG Abnormalities: none  Clinical Interpretation: This normal EEG is recorded in the waking and sleep state. There was no seizure or seizure predisposition recorded on this study. Please note that a normal EEG does not exclude the possibility of epilepsy.   Roland Rack, MD [facility] [phone]  If 7pm- 7am, please page neurology on call as listed in [place].

## 2014-07-09 ENCOUNTER — Ambulatory Visit
Admission: RE | Admit: 2014-07-09 | Discharge: 2014-07-09 | Disposition: A | Discharge status: Home / Self Care | Payer: Medicare Other | Source: Ambulatory Visit | Attending: Family Medicine | Admitting: Family Medicine

## 2014-07-09 DIAGNOSIS — R930 Abnormal findings on diagnostic imaging of skull and head, not elsewhere classified: Secondary | ICD-10-CM

## 2014-09-04 ENCOUNTER — Encounter: Payer: Self-pay | Admitting: Neurology

## 2014-09-04 ENCOUNTER — Ambulatory Visit (INDEPENDENT_AMBULATORY_CARE_PROVIDER_SITE_OTHER): Payer: Medicare Other | Admitting: Neurology

## 2014-09-04 VITALS — BP 126/82 | HR 81 | Resp 16 | Wt 166.0 lb

## 2014-09-04 DIAGNOSIS — R404 Transient alteration of awareness: Secondary | ICD-10-CM | POA: Diagnosis not present

## 2014-09-04 NOTE — Progress Notes (Signed)
NEUROLOGY CONSULTATION NOTE  Julie [name] MRN: [id number] DOB: 26-Jul-1945  Referring provider: Carlos Levering, PA-C Primary care provider: Carlos Levering, PA-C  Reason for consult:  Possible seizure  Thank you for your kind referral of Julie [name] for consultation of the above symptoms. Although her history is well known to you, please allow me to reiterate it for the purpose of our medical record. The patient was accompanied to the clinic by her daughter who also provides collateral information. Records and images were personally reviewed where available.  HISTORY OF PRESENT ILLNESS: This is a pleasant 69 year old right-handed woman with a history of hypertension, hyperlipidemia, right breast cancer s/p lumpectomy and radiation 4-1/2 years ago, presenting after an episode of spacing out last 06/15/2014. She recalls having an episode the day prior where she felt unsteady and nauseated ("in my head, not my stomach") lasting for a few seconds. The next day, she had 2 similar brief spells, then at dinnertime, she was witnessed by her daughter to have behavioral arrest, looking off to her right side with a blank look, then her hands flexed to her chest with mild shaking. She was unresponsive to family. This lasted 5-7 seconds, then she came to amnestic of the event, wondering why people were asking her questions. She denied feeling confusion, no focal numbness/tingling/weakness. She did not feel the similar dizziness prior to this episode. She denied any sleep deprivation or alcohol. No further episodes of behavioral arrest since then, however she had another brief episode of similar dizziness with nausea lasting only a few seconds last 09/02/14. She denies any gaps in time, olfactory/gustatory hallucinations, deja vu, focal numbness/tingling/weakness, myoclonic jerks. Since radiation therapy, her right hand would occasionally get jerky. She has infrequent headaches that are brief with no  associated nausea/vomiting/photo or phonophobia, no diplopia, dysarthria, dysphagia, neck/back pain, bowel/bladder dysfunction. She has had some memory changes, mostly for names and conversations, she denies getting lost driving. She is in charge of bills and denies any missed payments or medications. There is a strong family history of Alzheimer's disease in her mother, father, paternal and maternal aunts and uncles and grandparents.   Epilepsy Risk Factors:  She had a normal birth and early development.  There is no history of febrile convulsions, CNS infections such as meningitis/encephalitis, significant traumatic brain injury, neurosurgical procedures, or family history of seizures.  I personally reviewed MRI brain with and without contrast which showed mild diffuse atrophy and extensive chronic microvascular disease in the periventricular and subcortical regions, no acute findings. There was concern for possible aneurysm, that was not detected on follow-up MRA, with signal loss felt to be due to vascular calcification, irregularity consistent with atherosclerotic disease. Her routine wake and sleep EEG was normal.  PAST MEDICAL HISTORY: Past Medical History  Diagnosis Date  . Nipple discharge     right-all resolved after surgery  . Colon polyp   . Hypertension   . Cancer     4'12-breast rt; lumpectomy,radiation/Dr. Chancy Milroy, oncology  . Skin change 07-21-12    right abdomen LQ-due to past hx. Heparin injections  . Renal colic     PAST SURGICAL HISTORY: Past Surgical History  Procedure Laterality Date  . Cesarean section      x 1  . Parathyroidectomy    . Appendectomy    . Cystoscopy with stent placement Left 07/09/2012    Procedure: CYSTOSCOPY, RETROGRADE PYELOGRAM WITH LEFT STENT PLACEMENT;  Surgeon: Alexis Frock, MD;  Location: [facility];  Service:  Urology;  Laterality: Left;  . Breast surgery Right     lumpectomy  . Cystoscopy with retrograde pyelogram, ureteroscopy and stent  placement Left 07/28/2012    Procedure: CYSTOSCOPY WITH LEFT RETROGRADE PYELOGRAM, LEFT URETEROSCOPY AND STENT PLACEMENT;  Surgeon: Alexis Frock, MD;  Location: [facility];  Service: Urology;  Laterality: Left;  . Holmium laser application Left 09/25/1658    Procedure: HOLMIUM LASER APPLICATION;  Surgeon: Alexis Frock, MD;  Location: [facility];  Service: Urology;  Laterality: Left;  . Tonsillectomy      MEDICATIONS: Current Outpatient Prescriptions on File Prior to Visit  Medication Sig Dispense Refill  . anastrozole (ARIMIDEX) 1 MG tablet Take 1 tablet (1 mg total) by mouth daily. 90 tablet 3  . aspirin EC 81 MG tablet Take 81 mg by mouth daily.    Marland Kitchen atorvastatin (LIPITOR) 10 MG tablet Take 10 mg by mouth every evening.     . Calcium Carbonate-Vitamin D (CALCIUM + D) 600-200 MG-UNIT TABS Take 1 tablet by mouth daily.     Marland Kitchen losartan (COZAAR) 25 MG tablet Take 25 mg by mouth every morning.     No current facility-administered medications on file prior to visit.    ALLERGIES: No Known Allergies  FAMILY HISTORY: Family History  Problem Relation Age of Onset  . Hypertension Mother   . Hyperlipidemia Mother   . Hypertension Father   . Hyperlipidemia Father   . Alzheimer's disease Father     SOCIAL HISTORY: Social History   Social History  . Marital Status: Married    Spouse Name: N/A  . Number of Children: 4  . Years of Education: N/A   Occupational History  . Bookeeper    Social History Main Topics  . Smoking status: Former Smoker    Types: Cigarettes    Quit date: 07/21/1968  . Smokeless tobacco: Never Used  . Alcohol Use: No  . Drug Use: No  . Sexual Activity: Yes   Other Topics Concern  . Not on file   Social History Narrative    REVIEW OF SYSTEMS: Constitutional: No fevers, chills, or sweats, no generalized fatigue, change in appetite Eyes: No visual changes, double vision, eye pain Ear, nose and throat: No hearing loss, ear pain, nasal congestion, sore  throat Cardiovascular: No chest pain, palpitations Respiratory:  No shortness of breath at rest or with exertion, wheezes GastrointestinaI: No nausea, vomiting, diarrhea, abdominal pain, fecal incontinence Genitourinary:  No dysuria, urinary retention or frequency Musculoskeletal:  No neck pain, back pain Integumentary: No rash, pruritus, skin lesions Neurological: as above Psychiatric: No depression, insomnia, anxiety Endocrine: No palpitations, fatigue, diaphoresis, mood swings, change in appetite, change in weight, increased thirst Hematologic/Lymphatic:  No anemia, purpura, petechiae. Allergic/Immunologic: no itchy/runny eyes, nasal congestion, recent allergic reactions, rashes  PHYSICAL EXAM: Filed Vitals:   09/04/14 1013  BP: 126/82  Pulse: 81  Resp: 16   General: No acute distress Head:  Normocephalic/atraumatic Eyes: Fundoscopic exam shows bilateral sharp discs, no vessel changes, exudates, or hemorrhages Neck: supple, no paraspinal tenderness, full range of motion Back: No paraspinal tenderness Heart: regular rate and rhythm Lungs: Clear to auscultation bilaterally. Vascular: No carotid bruits. Skin/Extremities: No rash, no edema Neurological Exam: Mental status: alert and oriented to person, place, and time, no dysarthria or aphasia, Fund of knowledge is appropriate.  Recent and remote memory are intact.  Attention and concentration are normal.    Able to name objects and repeat phrases.  MMSE - Mini Mental State Exam 09/04/2014  Orientation to time 5  Orientation to Place 5  Registration 3  Attention/ Calculation 5  Recall 3  Language- name 2 objects 2  Language- repeat 1  Language- follow 3 step command 3  Language- read & follow direction 1  Write a sentence 1  Copy design 1  Total score 30   Cranial nerves: CN I: not tested CN II: pupils equal, round and reactive to light, visual fields intact, fundi unremarkable. CN III, IV, VI:  full range of motion, no  nystagmus, no ptosis CN V: facial sensation intact CN VII: upper and lower face symmetric CN VIII: hearing intact to finger rub CN IX, X: gag intact, uvula midline CN XI: sternocleidomastoid and trapezius muscles intact CN XII: tongue midline Bulk & Tone: normal, no fasciculations. Motor: 5/5 throughout with no pronator drift. Sensation: intact to light touch, cold, pin, vibration and joint position sense.  No extinction to double simultaneous stimulation.  Romberg test negative Deep Tendon Reflexes: +2 throughout except for absent ankle jerks bilaterally, no ankle clonus Plantar responses: downgoing bilaterally Cerebellar: no incoordination on finger to nose, heel to shin. No dysdiadochokinesia Gait: narrow-based and steady, able to tandem walk adequately. Tremor: none today except when asked to write (see attached)  IMPRESSION: This is a pleasant 69 year old right-handed woman with a history of hypertension, hyperlipidemia, right breast cancer s/p lumpectomy and radiation 4-1/2 years ago, who had an episode of behavioral arrest with unresponsiveness and right head turn, brief shaking last 06/15/2014, suggestive of complex partial seizure. MRI brain shows extensive white matter disease, routine EEG normal. We discussed that after an initial seizure, unless there are significant risk factors, an abnormal neurological exam, an EEG showing epileptiform abnormalities, and/or abnormal neuroimaging, treatment with an antiepileptic drug is not indicated. In her case however, she has had 4 episodes of sensation of unsteadiness and nausea that preceded this event the day prior and that morning, and one last Saturday, concerning for simple partial seizures. A 24-hour EEG will be ordered to further classify her seizures. After testing, would recommend starting anti-epileptic medication, unless she has another complex partial seizure before the EEG. They have agreed to hold off on starting medication for now  and discuss options on her follow-up. We discussed avoidance of seizure triggers, including sleep deprivation and alcohol intake. We discussed [place] driving restrictions which indicate a patient needs to free of seizures or events of altered awareness for 6 months prior to resuming driving. The patient agreed to comply with these restrictions.  Seizure precautions were discussed which include no driving, no bathing in a tub, no swimming alone, no cooking over an open flame, no operating dangerous machinery, and no activities which may endanger oneself or someone else. She will follow-up after the EEG and knows to call us for any changes in the interim.  Thank you for allowing me to participate in the care of this patient. Please do not hesitate to call for any questions or concerns.   Ellouise Newer, M.D.

## 2014-09-04 NOTE — Patient Instructions (Signed)
1. Schedule 24-hour EEG 2. Continue to monitor symptoms, call for any recurrence of staring episodes 3. As per [place] driving laws, for any episode of loss of awareness/consciousness, one should not drive until 6 months event-free 4. Follow-up after EEG

## 2014-09-13 ENCOUNTER — Ambulatory Visit (INDEPENDENT_AMBULATORY_CARE_PROVIDER_SITE_OTHER): Payer: Medicare Other | Admitting: Neurology

## 2014-09-13 DIAGNOSIS — R404 Transient alteration of awareness: Secondary | ICD-10-CM | POA: Diagnosis not present

## 2014-09-22 ENCOUNTER — Telehealth: Payer: Self-pay | Admitting: Family Medicine

## 2014-09-22 NOTE — Telephone Encounter (Signed)
-----   Message from [name], MD sent at 09/22/2014  2:46 PM EDT ----- Pls let her know the EEG did not show any seizure discharges, would like to discuss the results with her further on follow-up. Thanks

## 2014-09-22 NOTE — Telephone Encounter (Signed)
Patient notified of result. 

## 2014-10-03 ENCOUNTER — Encounter: Payer: Self-pay | Admitting: Neurology

## 2014-10-03 ENCOUNTER — Ambulatory Visit (INDEPENDENT_AMBULATORY_CARE_PROVIDER_SITE_OTHER): Payer: Medicare Other | Admitting: Neurology

## 2014-10-03 VITALS — BP 122/80 | HR 87 | Resp 16 | Wt 162.0 lb

## 2014-10-03 DIAGNOSIS — G40209 Localization-related (focal) (partial) symptomatic epilepsy and epileptic syndromes with complex partial seizures, not intractable, without status epilepticus: Secondary | ICD-10-CM | POA: Diagnosis not present

## 2014-10-03 MED ORDER — LAMOTRIGINE 25 MG PO TABS: ORAL_TABLET | ORAL | Status: DC

## 2014-10-03 NOTE — Progress Notes (Signed)
NEUROLOGY FOLLOW UP OFFICE NOTE  Julie [name] [id number]  HISTORY OF PRESENT ILLNESS: I had the pleasure of seeing Julie [name] in follow-up in the neurology clinic on 10/03/2014.  The patient was last seen a month ago after an episode of behavioral arrest with right head turn last 06/15/14. This was preceded by brief episodes of feeling unsteady and nauseated the day prior and the morning of the episode. She denies any similar symptoms since then. She reports she has been doing very well, she denies any headaches, dizziness, focal numbness/tingling/weakness, olfactory/gustatory hallucinations, myoclonic jerks. Records and images were personally reviewed where available.  I personally reviewed 24-hour EEG which was mildly abnormal due to mild diffuse slowing of the waking background, at times sharply contoured over the bilateral temporal regions, consistent with wicket spikes. There were no clear epileptiform discharges seen. Typical events were not captured.  HPI: This is a pleasant 69 yo RH woman with a history of hypertension, hyperlipidemia, right breast cancer s/p lumpectomy and radiation 4-1/2 years ago, who had an episode of spacing out last 06/15/2014. She recalls having an episode the day prior where she felt unsteady and nauseated ("in my head, not my stomach") lasting for a few seconds. The next day, she had 2 similar brief spells, then at dinnertime, she was witnessed by her daughter to have behavioral arrest, looking off to her right side with a blank look, then her hands flexed to her chest with mild shaking. She was unresponsive to family. This lasted 5-7 seconds, then she came to amnestic of the event, wondering why people were asking her questions. She denied feeling confusion, no focal numbness/tingling/weakness. She did not feel the similar dizziness prior to this episode. She denied any sleep deprivation or alcohol. No further episodes of behavioral arrest since then, however she had  another brief episode of similar dizziness with nausea lasting only a few seconds last 09/02/14. She denies any gaps in time, olfactory/gustatory hallucinations, deja vu, focal numbness/tingling/weakness, myoclonic jerks. Since radiation therapy, her right hand would occasionally get jerky. She has infrequent headaches that are brief with no associated nausea/vomiting/photo or phonophobia, no diplopia, dysarthria, dysphagia, neck/back pain, bowel/bladder dysfunction. She has had some memory changes, mostly for names and conversations, she denies getting lost driving. She is in charge of bills and denies any missed payments or medications. There is a strong family history of Alzheimer's disease in her mother, father, paternal and maternal aunts and uncles and grandparents.   Epilepsy Risk Factors: She had a normal birth and early development. There is no history of febrile convulsions, CNS infections such as meningitis/encephalitis, significant traumatic brain injury, neurosurgical procedures, or family history of seizures.  I personally reviewed MRI brain with and without contrast which showed mild diffuse atrophy and extensive chronic microvascular disease in the periventricular and subcortical regions, no acute findings. There was concern for possible aneurysm, that was not detected on follow-up MRA, with signal loss felt to be due to vascular calcification, irregularity consistent with atherosclerotic disease. Her routine wake and sleep EEG was normal.  PAST MEDICAL HISTORY: Past Medical History  Diagnosis Date  . Nipple discharge     right-all resolved after surgery  . Colon polyp   . Hypertension   . Cancer     4'12-breast rt; lumpectomy,radiation/Dr. Chancy Milroy, oncology  . Skin change 07-21-12    right abdomen LQ-due to past hx. Heparin injections  . Renal colic     MEDICATIONS: Current Outpatient Prescriptions on File Prior to  Visit  Medication Sig Dispense Refill  . alendronate (FOSAMAX) 70  MG tablet Take 70 mg by mouth once a week. Take with a full glass of water on an empty stomach.    Marland Kitchen anastrozole (ARIMIDEX) 1 MG tablet Take 1 tablet (1 mg total) by mouth daily. 90 tablet 3  . aspirin EC 81 MG tablet Take 81 mg by mouth daily.    Marland Kitchen atorvastatin (LIPITOR) 10 MG tablet Take 10 mg by mouth every evening.     . Calcium Carbonate-Vitamin D (CALCIUM + D) 600-200 MG-UNIT TABS Take 1 tablet by mouth daily.     Marland Kitchen losartan (COZAAR) 25 MG tablet Take 25 mg by mouth every morning.     No current facility-administered medications on file prior to visit.    ALLERGIES: No Known Allergies  FAMILY HISTORY: Family History  Problem Relation Age of Onset  . Hypertension Mother   . Hyperlipidemia Mother   . Hypertension Father   . Hyperlipidemia Father   . Alzheimer's disease Father     SOCIAL HISTORY: Social History   Social History  . Marital Status: Married    Spouse Name: N/A  . Number of Children: 4  . Years of Education: N/A   Occupational History  . Bookeeper    Social History Main Topics  . Smoking status: Former Smoker    Types: Cigarettes    Quit date: 07/21/1968  . Smokeless tobacco: Never Used  . Alcohol Use: No  . Drug Use: No  . Sexual Activity: Yes   Other Topics Concern  . Not on file   Social History Narrative    REVIEW OF SYSTEMS: Constitutional: No fevers, chills, or sweats, no generalized fatigue, change in appetite Eyes: No visual changes, double vision, eye pain Ear, nose and throat: No hearing loss, ear pain, nasal congestion, sore throat Cardiovascular: No chest pain, palpitations Respiratory:  No shortness of breath at rest or with exertion, wheezes GastrointestinaI: No nausea, vomiting, diarrhea, abdominal pain, fecal incontinence Genitourinary:  No dysuria, urinary retention or frequency Musculoskeletal:  No neck pain, back pain Integumentary: No rash, pruritus, skin lesions Neurological: as above Psychiatric: No depression,  insomnia, anxiety Endocrine: No palpitations, fatigue, diaphoresis, mood swings, change in appetite, change in weight, increased thirst Hematologic/Lymphatic:  No anemia, purpura, petechiae. Allergic/Immunologic: no itchy/runny eyes, nasal congestion, recent allergic reactions, rashes  PHYSICAL EXAM: Filed Vitals:   10/03/14 1554  BP: 122/80  Pulse: 87  Resp: 16   General: No acute distress Head:  Normocephalic/atraumatic Neck: supple, no paraspinal tenderness, full range of motion Heart:  Regular rate and rhythm Lungs:  Clear to auscultation bilaterally Back: No paraspinal tenderness Skin/Extremities: No rash, no edema Neurological Exam: alert and oriented to person, place, and time. No aphasia or dysarthria. Fund of knowledge is appropriate.  Recent and remote memory are intact.  Attention and concentration are normal.    Able to name objects and repeat phrases. Cranial nerves: Pupils equal, round, reactive to light.  Fundoscopic exam unremarkable, no papilledema. Extraocular movements intact with no nystagmus. Visual fields full. Facial sensation intact. No facial asymmetry. Tongue, uvula, palate midline.  Motor: Bulk and tone normal, muscle strength 5/5 throughout with no pronator drift.  Sensation to light touch intact.  No extinction to double simultaneous stimulation.  Deep tendon reflexes 2+ throughout, toes downgoing.  Finger to nose testing intact.  Gait narrow-based and steady, able to tandem walk adequately.  Romberg negative.  IMPRESSION: This is a pleasant 70 yo RH woman  with a history of hypertension, hyperlipidemia, right breast cancer s/p lumpectomy and radiation 4-1/2 years ago, who had an episode of behavioral arrest with unresponsiveness and right head turn, brief shaking last 06/15/2014, suggestive of complex partial seizure. MRI brain shows extensive white matter disease, routine EEG normal. Her 24-hour EEG did not show any epileptiform discharges, there was non-specific  mild diffuse background slowing. She denies any further similar symptoms, including the unsteadiness and nausea she felt the morning of and day prior to the event. I discussed symptoms and test findings with patient. Although her EEG did not show any epileptiform discharges, the episodes are concerning for partial seizures. I would recommend starting anti-epileptic medication, options were discussed, she will start low dose Lamotrigine with uptitration over the next few weeks. Side effects, including Kathreen Cosier syndrome, were discussed. We again discussed [place] driving restrictions which indicate a patient needs to free of seizures or events of altered awareness for 6 months prior to resuming driving. She will follow-up in 1 month and knows to call our office for any changes in the interim.  Thank you for allowing me to participate in her care.  Please do not hesitate to call for any questions or concerns.  The duration of this appointment visit was 24 minutes of face-to-face time with the patient.  Greater than 50% of this time was spent in counseling, explanation of diagnosis, planning of further management, and coordination of care.   Ellouise Newer, M.D.   CC: Dr. Percell Miller

## 2014-10-03 NOTE — Patient Instructions (Signed)
1. Start Lamotrigine 25mg : Take 1 tablet at night for 1 week, then increase to 1 tablet twice a day for 2 weeks, then increase to 2 tablets twice a day and continue 2. Call our office for any problems 3. Follow-up in 1 month 4. As per [place] driving laws, after an episode of loss of awareness, one should not drive until 6 months event-free  Seizure Precautions: 1. If medication has been prescribed for you to prevent seizures, take it exactly as directed.  Do not stop taking the medicine without talking to your doctor first, even if you have not had a seizure in a long time.   2. Avoid activities in which a seizure would cause danger to yourself or to others.  Don't operate dangerous machinery, swim alone, or climb in high or dangerous places, such as on ladders, roofs, or girders.  Do not drive unless your doctor says you may.  3. If you have any warning that you may have a seizure, lay down in a safe place where you can't hurt yourself.    4.  No driving for 6 months from last seizure, as per [facility].   Please refer to the following link on the [facility] website for more information: [email or web address]   5.  Maintain good sleep hygiene.  6.  Contact your doctor if you have any problems that may be related to the medicine you are taking.  7.  Call 911 and bring the patient back to the ED if:        A.  The seizure lasts longer than 5 minutes.       B.  The patient doesn't awaken shortly after the seizure  C.  The patient has new problems such as difficulty seeing, speaking or moving  D.  The patient was injured during the seizure  E.  The patient has a temperature over 102 F (39C)  F.  The patient vomited and now is having trouble breathing

## 2014-10-11 ENCOUNTER — Encounter: Payer: Self-pay | Admitting: Neurology

## 2014-10-11 NOTE — Procedures (Signed)
ELECTROENCEPHALOGRAM REPORT  Dates of Recording: 09/13/2014 to 09/14/2014  Patient's Name: Julie [name] MRN: [id number] Date of Birth: 10-09-1945  Referring Provider: Dr. Ellouise Newer  Procedure: 24-hour ambulatory EEG  History: This is a 69 year old woman with an episode of behavioral arrest with unresponsiveness and right head turn, brief shaking last 06/15/2014. This was preceded that morning and the day before by 4 episodes of sensation of unsteadiness and nausea. EEG for classification.  Medications: Arimedex, aspirin, Lipitor, Cozaar  Technical Summary: This is a 24-hour multichannel digital EEG recording measured by the international 10-20 system with electrodes applied with paste and impedances below 5000 ohms performed as portable with EKG monitoring.  The digital EEG was referentially recorded, reformatted, and digitally filtered in a variety of bipolar and referential montages for optimal display.    DESCRIPTION OF RECORDING: During maximal wakefulness, the background activity consisted of a symmetric 9 Hz posterior dominant rhythm which was reactive to eye opening. There was mild diffuse 6-7 Hz theta slowing of the waking background, at times sharply contoured over the bilateral temporal regions consistent with wicket spikes, a normal variant with no pathological significance. There were no clear epileptiform discharges seen in wakefulness.  During the recording, the patient progresses through wakefulness, drowsiness, and Stage 2 sleep.  Similar wicket spikes are seen independently over the bilateral temporal regions. Again, there were no clear epileptiform discharges seen.  Events: There were no push button events. Patient did not complete diary.  There were no electrographic seizures seen.  EKG lead was unremarkable.  IMPRESSION: This 24-hour ambulatory EEG study is mildly abnormal due to mild diffuse slowing of the waking background.  CLINICAL CORRELATION of the above  findings indicates mild diffuse cerebral dysfunction that is non-specific in etiology and may be seen with hypoxic/ischemic injury, toxic/metabolic encephalopathies, or medication effect. The absence of epileptiform discharges does not exclude a clinical diagnosis of epilepsy. Typical events were not captured.  If further clinical questions remain, inpatient video EEG monitoring may be helpful.   Ellouise Newer, M.D.

## 2014-11-06 ENCOUNTER — Encounter: Payer: Self-pay | Admitting: Neurology

## 2014-11-06 ENCOUNTER — Ambulatory Visit (INDEPENDENT_AMBULATORY_CARE_PROVIDER_SITE_OTHER): Payer: Medicare Other | Admitting: Neurology

## 2014-11-06 VITALS — BP 130/88 | HR 94 | Resp 16 | Wt 163.0 lb

## 2014-11-06 DIAGNOSIS — G40209 Localization-related (focal) (partial) symptomatic epilepsy and epileptic syndromes with complex partial seizures, not intractable, without status epilepticus: Secondary | ICD-10-CM

## 2014-11-06 MED ORDER — LAMOTRIGINE 100 MG PO TABS: ORAL_TABLET | ORAL | Status: DC

## 2014-11-06 NOTE — Progress Notes (Signed)
NEUROLOGY FOLLOW UP OFFICE NOTE  Julie [name] [id number]  HISTORY OF PRESENT ILLNESS: I had the pleasure of seeing Julie [name] in follow-up in the neurology clinic on 11/06/2014.  The patient was last seen a month ago after an episode of behavioral arrest with right head turn last 06/15/14. This was preceded by brief episodes of feeling unsteady and nauseated the day prior and the morning of the episode. She denies any similar symptoms since then. Since her last visit, she started Lamotrigine, and is currently on 50mg  BID with no side effects. She initially had some abdominal pain, but none in the past 2 weeks. She reports she has been doing very well, she denies any headaches, dizziness, focal numbness/tingling/weakness, olfactory/gustatory hallucinations, myoclonic jerks. Family has not seen any similar symptoms since June.   HPI: This is a pleasant 69 yo RH woman with a history of hypertension, hyperlipidemia, right breast cancer s/p lumpectomy and radiation 4-1/2 years ago, who had an episode of spacing out last 06/15/2014. She recalls having an episode the day prior where she felt unsteady and nauseated ("in my head, not my stomach") lasting for a few seconds. The next day, she had 2 similar brief spells, then at dinnertime, she was witnessed by her daughter to have behavioral arrest, looking off to her right side with a blank look, then her hands flexed to her chest with mild shaking. She was unresponsive to family. This lasted 5-7 seconds, then she came to amnestic of the event, wondering why people were asking her questions. She denied feeling confusion, no focal numbness/tingling/weakness. She did not feel the similar dizziness prior to this episode. She denied any sleep deprivation or alcohol. No further episodes of behavioral arrest since then, however she had another brief episode of similar dizziness with nausea lasting only a few seconds last 09/02/14. She denies any gaps in time,  olfactory/gustatory hallucinations, deja vu, focal numbness/tingling/weakness, myoclonic jerks. Since radiation therapy, her right hand would occasionally get jerky. She has infrequent headaches that are brief with no associated nausea/vomiting/photo or phonophobia, no diplopia, dysarthria, dysphagia, neck/back pain, bowel/bladder dysfunction. She has had some memory changes, mostly for names and conversations, she denies getting lost driving. She is in charge of bills and denies any missed payments or medications. There is a strong family history of Alzheimer's disease in her mother, father, paternal and maternal aunts and uncles and grandparents.   Epilepsy Risk Factors: She had a normal birth and early development. There is no history of febrile convulsions, CNS infections such as meningitis/encephalitis, significant traumatic brain injury, neurosurgical procedures, or family history of seizures.  Diagnostic Data: MRI brain with and without contrast which showed mild diffuse atrophy and extensive chronic microvascular disease in the periventricular and subcortical regions, no acute findings. There was concern for possible aneurysm, that was not detected on follow-up MRA, with signal loss felt to be due to vascular calcification, irregularity consistent with atherosclerotic disease. Her routine wake and sleep EEG was normal. 24-hour EEG which was mildly abnormal due to mild diffuse slowing of the waking background, at times sharply contoured over the bilateral temporal regions, consistent with wicket spikes. There were no clear epileptiform discharges seen. Typical events were not captured.  PAST MEDICAL HISTORY: Past Medical History  Diagnosis Date  . Nipple discharge     right-all resolved after surgery  . Colon polyp   . Hypertension   . Cancer ([place])     4'12-breast rt; lumpectomy,radiation/Dr. Chancy Milroy, oncology  . Skin change  07-21-12    right abdomen LQ-due to past hx. Heparin injections  .  Renal colic     MEDICATIONS: Current Outpatient Prescriptions on File Prior to Visit  Medication Sig Dispense Refill  . alendronate (FOSAMAX) 70 MG tablet Take 70 mg by mouth once a week. Take with a full glass of water on an empty stomach.    Marland Kitchen anastrozole (ARIMIDEX) 1 MG tablet Take 1 tablet (1 mg total) by mouth daily. 90 tablet 3  . aspirin EC 81 MG tablet Take 81 mg by mouth daily.    Marland Kitchen atorvastatin (LIPITOR) 10 MG tablet Take 10 mg by mouth every evening.     . Calcium Carbonate-Vitamin D (CALCIUM + D) 600-200 MG-UNIT TABS Take 1 tablet by mouth daily.     Marland Kitchen lamoTRIgine (LAMICTAL) 25 MG tablet Take 1 tablet at night for 1 week, then increase to 1 tablet twice a day for 2 weeks, then increase to 2 tablets twice a day and continue (Patient taking differently: Take 2 tablets twice daily) 120 tablet 3  . losartan (COZAAR) 25 MG tablet Take 25 mg by mouth every morning.     No current facility-administered medications on file prior to visit.    ALLERGIES: No Known Allergies  FAMILY HISTORY: Family History  Problem Relation Age of Onset  . Hypertension Mother   . Hyperlipidemia Mother   . Hypertension Father   . Hyperlipidemia Father   . Alzheimer's disease Father     SOCIAL HISTORY: Social History   Social History  . Marital Status: Married    Spouse Name: N/A  . Number of Children: 4  . Years of Education: N/A   Occupational History  . Bookeeper    Social History Main Topics  . Smoking status: Former Smoker    Types: Cigarettes    Quit date: 07/21/1968  . Smokeless tobacco: Never Used  . Alcohol Use: No  . Drug Use: No  . Sexual Activity: Yes   Other Topics Concern  . Not on file   Social History Narrative    REVIEW OF SYSTEMS: Constitutional: No fevers, chills, or sweats, no generalized fatigue, change in appetite Eyes: No visual changes, double vision, eye pain Ear, nose and throat: No hearing loss, ear pain, nasal congestion, sore  throat Cardiovascular: No chest pain, palpitations Respiratory:  No shortness of breath at rest or with exertion, wheezes GastrointestinaI: No nausea, vomiting, diarrhea, abdominal pain, fecal incontinence Genitourinary:  No dysuria, urinary retention or frequency Musculoskeletal:  No neck pain, back pain Integumentary: No rash, pruritus, skin lesions Neurological: as above Psychiatric: No depression, insomnia, anxiety Endocrine: No palpitations, fatigue, diaphoresis, mood swings, change in appetite, change in weight, increased thirst Hematologic/Lymphatic:  No anemia, purpura, petechiae. Allergic/Immunologic: no itchy/runny eyes, nasal congestion, recent allergic reactions, rashes  PHYSICAL EXAM: Filed Vitals:   11/06/14 1127  BP: 130/88  Pulse: 94  Resp: 16   General: No acute distress Head:  Normocephalic/atraumatic Neck: supple, no paraspinal tenderness, full range of motion Heart:  Regular rate and rhythm Lungs:  Clear to auscultation bilaterally Back: No paraspinal tenderness Skin/Extremities: No rash, no edema Neurological Exam: alert and oriented to person, place, and time. No aphasia or dysarthria. Fund of knowledge is appropriate.  Recent and remote memory are intact. 3/3 delayed recall.  Attention and concentration are normal.    Able to name objects and repeat phrases. Cranial nerves: Pupils equal, round, reactive to light.  Fundoscopic exam unremarkable, no papilledema. Extraocular movements intact with no  nystagmus. Visual fields full. Facial sensation intact. No facial asymmetry. Tongue, uvula, palate midline.  Motor: Bulk and tone normal, muscle strength 5/5 throughout with no pronator drift.  Sensation to light touch intact.  No extinction to double simultaneous stimulation.  Deep tendon reflexes 2+ throughout, toes downgoing.  Finger to nose testing intact.  Gait narrow-based and steady, able to tandem walk adequately.  Romberg negative.  IMPRESSION: This is a pleasant  68 yo RH woman with a history of hypertension, hyperlipidemia, right breast cancer s/p lumpectomy and radiation 4-1/2 years ago, who had an episode of behavioral arrest with unresponsiveness and right head turn, brief shaking last 06/15/2014, suggestive of complex partial seizure. MRI brain shows extensive white matter disease, routine EEG normal. Her 24-hour EEG did not show any epileptiform discharges, there was non-specific mild diffuse background slowing. She denies any further similar symptoms, including the unsteadiness and nausea she felt the morning of and day prior to the event. Although her EEG did not show any epileptiform discharges, the episodes are concerning for partial seizures. She is now on Lamotrigine 50mg  BID and will increase to 100mg  BID. We again discussed [place] driving restrictions which indicate a patient needs to free of seizures or events of altered awareness for 6 months prior to resuming driving. She will follow-up in 2 months and knows to call our office for any changes in the interim.  Thank you for allowing me to participate in her care.  Please do not hesitate to call for any questions or concerns.  The duration of this appointment visit was 24 minutes of face-to-face time with the patient.  Greater than 50% of this time was spent in counseling, explanation of diagnosis, planning of further management, and coordination of care.   Ellouise Newer, M.D.   CC: Dr. Percell Miller

## 2014-11-06 NOTE — Patient Instructions (Signed)
1. Once you are done with your current bottle, your new prescription will be for Lamotrigine 100mg : Take 1 tablet twice a day 2. As per  driving laws, after an episode of loss of awareness, one should not drive until 6 months event-free 3. Follow-up in 2 months, call for any changes  Seizure Precautions: 1. If medication has been prescribed for you to prevent seizures, take it exactly as directed.  Do not stop taking the medicine without talking to your doctor first, even if you have not had a seizure in a long time.   2. Avoid activities in which a seizure would cause danger to yourself or to others.  Don't operate dangerous machinery, swim alone, or climb in high or dangerous places, such as on ladders, roofs, or girders.  Do not drive unless your doctor says you may.  3. If you have any warning that you may have a seizure, lay down in a safe place where you can't hurt yourself.    4.  No driving for 6 months from last seizure, as per [facility].   Please refer to the following link on the [place] website for more information: http://www.epilepsyfoundation.org/answerplace/Social/driving/drivingu.cfm   5.  Maintain good sleep hygiene.  6.  Contact your doctor if you have any problems that may be related to the medicine you are taking.  7.  Call 911 and bring the patient back to the ED if:        A.  The seizure lasts longer than 5 minutes.       B.  The patient doesn't awaken shortly after the seizure  C.  The patient has new problems such as difficulty seeing, speaking or moving  D.  The patient was injured during the seizure  E.  The patient has a temperature over 102 F (39C)  F.  The patient vomited and now is having trouble breathing

## 2014-12-06 ENCOUNTER — Other Ambulatory Visit: Payer: Self-pay

## 2014-12-06 DIAGNOSIS — C50311 Malignant neoplasm of lower-inner quadrant of right female breast: Secondary | ICD-10-CM

## 2014-12-07 ENCOUNTER — Encounter: Payer: Self-pay | Admitting: Hematology and Oncology

## 2014-12-07 ENCOUNTER — Telehealth: Payer: Self-pay | Admitting: Hematology and Oncology

## 2014-12-07 ENCOUNTER — Other Ambulatory Visit (HOSPITAL_BASED_OUTPATIENT_CLINIC_OR_DEPARTMENT_OTHER): Payer: Medicare Other

## 2014-12-07 ENCOUNTER — Ambulatory Visit (HOSPITAL_BASED_OUTPATIENT_CLINIC_OR_DEPARTMENT_OTHER): Payer: Medicare Other | Admitting: Hematology and Oncology

## 2014-12-07 VITALS — BP 127/55 | HR 95 | Temp 98.2°F | Resp 18 | Ht 63.0 in | Wt 160.2 lb

## 2014-12-07 DIAGNOSIS — Z79811 Long term (current) use of aromatase inhibitors: Secondary | ICD-10-CM

## 2014-12-07 DIAGNOSIS — C50311 Malignant neoplasm of lower-inner quadrant of right female breast: Secondary | ICD-10-CM

## 2014-12-07 LAB — CBC WITH DIFFERENTIAL/PLATELET
BASO%: 1.2 % (ref 0.0–2.0)
BASOS ABS: 0.1 10*3/uL (ref 0.0–0.1)
EOS ABS: 0.2 10*3/uL (ref 0.0–0.5)
EOS%: 2.7 % (ref 0.0–7.0)
HCT: 45.5 % (ref 34.8–46.6)
HGB: 15.1 g/dL (ref 11.6–15.9)
LYMPH%: 20.8 % (ref 14.0–49.7)
MCH: 30.7 pg (ref 25.1–34.0)
MCHC: 33.2 g/dL (ref 31.5–36.0)
MCV: 92.4 fL (ref 79.5–101.0)
MONO#: 0.5 10*3/uL (ref 0.1–0.9)
MONO%: 7.3 % (ref 0.0–14.0)
NEUT#: 4.4 10*3/uL (ref 1.5–6.5)
NEUT%: 68 % (ref 38.4–76.8)
PLATELETS: 227 10*3/uL (ref 145–400)
RBC: 4.92 10*6/uL (ref 3.70–5.45)
RDW: 15.4 % — ABNORMAL HIGH (ref 11.2–14.5)
WBC: 6.5 10*3/uL (ref 3.9–10.3)
lymph#: 1.3 10*3/uL (ref 0.9–3.3)

## 2014-12-07 LAB — COMPREHENSIVE METABOLIC PANEL (CC13)
ALK PHOS: 83 U/L (ref 40–150)
ALT: 13 U/L (ref 0–55)
ANION GAP: 9 meq/L (ref 3–11)
AST: 21 U/L (ref 5–34)
Albumin: 4.3 g/dL (ref 3.5–5.0)
BILIRUBIN TOTAL: 1.18 mg/dL (ref 0.20–1.20)
BUN: 15.8 mg/dL (ref 7.0–26.0)
CO2: 25 meq/L (ref 22–29)
Calcium: 9.4 mg/dL (ref 8.4–10.4)
Chloride: 106 mEq/L (ref 98–109)
Creatinine: 0.9 mg/dL (ref 0.6–1.1)
EGFR: 64 mL/min/{1.73_m2} — AB (ref >90)
GLUCOSE: 108 mg/dL (ref 70–140)
POTASSIUM: 4 meq/L (ref 3.5–5.1)
Sodium: 140 mEq/L (ref 136–145)
Total Protein: 7.3 g/dL (ref 6.4–8.3)

## 2014-12-07 NOTE — Assessment & Plan Note (Signed)
Right breast invasive ductal carcinoma grade 1, 0.8 cm and DCIS, 2 SLN negative, T1 B. N0 M0 stage IA, status post lumpectomy and radiation and is currently on Arimidex started August 2012  Arimidex related toxicities: patient does not have any side effects to Arimidex. I discussed the data from MA 17 clinical trial comparing 10 years versus 5 years of antiestrogen therapy. The biggest benefit on this trial was noted in patients were high risk disease. I would like to obtain breast cancer index to determine if she would benefit from extended adjuvant therapy.  Breast Cancer Surveillance: 1. Breast exam 12/07/2014: Normal 2. Mammogram 05/24/2014 No abnormalities. Postsurgical changes. Breast Density Category A. I recommended that she get 3-D mammograms for surveillance. Discussed the differences between different breast density categories.   Return to clinic in 1 year for follow-up

## 2014-12-07 NOTE — Addendum Note (Signed)
Addended by: Prentiss Bells on: 12/07/2014 04:55 PM   Modules accepted: Medications

## 2014-12-07 NOTE — Telephone Encounter (Signed)
Gave patient avs report and appointment for November 2017.  °

## 2014-12-07 NOTE — Progress Notes (Signed)
Patient Care Team: Carlos Levering, PA-C as PCP - General (Family Medicine)  DIAGNOSIS: No matching staging information was found for the patient.  SUMMARY OF ONCOLOGIC HISTORY:   Breast cancer of lower-inner quadrant of right female breast ([place])   05/01/2010 Initial Diagnosis Right breast mass: Invasive mammary cancer ER 100% PR 60% HER-2 negative Ki-67 10% MRI showed a 6 mm nodule upper-outer quadrant right breast   05/16/2010 Surgery Right breast lumpectomy: Invasive ductal carcinoma grade 1, 0.8 cm and DCIS, 2 SLN negative T1 B. N0 M0 stage IA   06/12/2010 - 12/04/2010 Radiation Therapy Adjuvant radiation therapy   09/05/2010 -  Anti-estrogen oral therapy Arimidex 1 mg daily plan is for 5 years    CHIEF COMPLIANT: Follow-up of right breast cancer  INTERVAL HISTORY: Julie [name] is a 69 year old with above-mentioned history right breast cancer treated with lumpectomy radiation and is currently on Arimidex therapy. She is now onto her fifth year of treatment. She has had no side effects of the treatment. She has 3 children and 8 grandchildren and she stays very busy. Denies any lumps or nodules in the breasts. Mammogram done in May was normal.  REVIEW OF SYSTEMS:   Constitutional: Denies fevers, chills or abnormal weight loss Eyes: Denies blurriness of vision Ears, nose, mouth, throat, and face: Denies mucositis or sore throat Respiratory: Denies cough, dyspnea or wheezes Cardiovascular: Denies palpitation, chest discomfort or lower extremity swelling Gastrointestinal:  Denies nausea, heartburn or change in bowel habits Skin: Denies abnormal skin rashes Lymphatics: Denies new lymphadenopathy or easy bruising Neurological:Denies numbness, tingling or new weaknesses Behavioral/Psych: Mood is stable, no new changes  Breast:  denies any pain or lumps or nodules in either breasts All other systems were reviewed with the patient and are negative.  I have reviewed the past medical  history, past surgical history, social history and family history with the patient and they are unchanged from previous note.  ALLERGIES:  has No Known Allergies.  MEDICATIONS:  Current Outpatient Prescriptions  Medication Sig Dispense Refill  . alendronate (FOSAMAX) 70 MG tablet Take 70 mg by mouth once a week. Take with a full glass of water on an empty stomach.    Marland Kitchen anastrozole (ARIMIDEX) 1 MG tablet Take 1 tablet (1 mg total) by mouth daily. 90 tablet 3  . aspirin EC 81 MG tablet Take 81 mg by mouth daily.    Marland Kitchen atorvastatin (LIPITOR) 10 MG tablet Take 10 mg by mouth every evening.     . Calcium Carbonate-Vitamin D (CALCIUM + D) 600-200 MG-UNIT TABS Take 1 tablet by mouth daily.     Marland Kitchen lamoTRIgine (LAMICTAL) 100 MG tablet Take 1 tablet twice a day 60 tablet 6  . losartan (COZAAR) 25 MG tablet Take 25 mg by mouth every morning.     No current facility-administered medications for this visit.    PHYSICAL EXAMINATION: ECOG PERFORMANCE STATUS: 1 - Symptomatic but completely ambulatory  Filed Vitals:   12/07/14 0949  BP: 127/55  Pulse: 95  Temp: 98.2 F (36.8 C)  Resp: 18   Filed Weights   12/07/14 0949  Weight: 160 lb 3.2 oz (72.666 kg)    GENERAL:alert, no distress and comfortable SKIN: skin color, texture, turgor are normal, no rashes or significant lesions EYES: normal, Conjunctiva are pink and non-injected, sclera clear OROPHARYNX:no exudate, no erythema and lips, buccal mucosa, and tongue normal  NECK: supple, thyroid normal size, non-tender, without nodularity LYMPH:  no palpable lymphadenopathy in the cervical, axillary  or inguinal LUNGS: clear to auscultation and percussion with normal breathing effort HEART: regular rate & rhythm and no murmurs and no lower extremity edema ABDOMEN:abdomen soft, non-tender and normal bowel sounds Musculoskeletal:no cyanosis of digits and no clubbing  NEURO: alert & oriented x 3 with fluent speech, no focal motor/sensory  deficits BREAST: No palpable masses or nodules in either right or left breasts. No palpable axillary supraclavicular or infraclavicular adenopathy no breast tenderness or nipple discharge. (exam performed in the presence of a chaperone)  LABORATORY DATA:  I have reviewed the data as listed   Chemistry      Component Value Date/Time   NA 142 12/05/2013 1041   NA 135 07/10/2012 0350   K 4.1 12/05/2013 1041   K 4.5 07/10/2012 0350   CL 103 07/10/2012 0350   CL 107 04/12/2012 1349   CO2 24 12/05/2013 1041   CO2 18* 07/10/2012 0350   BUN 13.9 12/05/2013 1041   BUN 21 07/10/2012 0350   CREATININE 0.8 12/05/2013 1041   CREATININE 0.70 09/14/2013 0839      Component Value Date/Time   CALCIUM 9.8 12/05/2013 1041   CALCIUM 8.7 07/10/2012 0350   ALKPHOS 120 12/05/2013 1041   ALKPHOS 93 07/17/2011 1356   AST 28 12/05/2013 1041   AST 29 07/17/2011 1356   ALT 24 12/05/2013 1041   ALT 27 07/17/2011 1356   BILITOT 1.22* 12/05/2013 1041   BILITOT 0.8 07/17/2011 1356       Lab Results  Component Value Date   WBC 6.5 12/07/2014   HGB 15.1 12/07/2014   HCT 45.5 12/07/2014   MCV 92.4 12/07/2014   PLT 227 12/07/2014   NEUTROABS 4.4 12/07/2014     ASSESSMENT & PLAN:  Breast cancer of lower-inner quadrant of right female breast Right breast invasive ductal carcinoma grade 1, 0.8 cm and DCIS, 2 SLN negative, T1 B. N0 M0 stage IA, status post lumpectomy and radiation and is currently on Arimidex started August 2012  Arimidex related toxicities: patient does not have any side effects to Arimidex. I discussed the data from MA 17 clinical trial comparing 10 years versus 5 years of antiestrogen therapy. The biggest benefit on this trial was noted in patients were high risk disease. I would like to obtain breast cancer index to determine if she would benefit from extended adjuvant therapy.  Breast Cancer Surveillance: 1. Breast exam 12/07/2014: Normal 2. Mammogram 05/24/2014 No abnormalities.  Postsurgical changes. Breast Density Category A. I recommended that she get 3-D mammograms for surveillance. Discussed the differences between different breast density categories.   Return to clinic in 1 year for follow-up   No orders of the defined types were placed in this encounter.   The patient has a good understanding of the overall plan. she agrees with it. she will call with any problems that may develop before the next visit here.   Rulon Eisenmenger, MD 12/07/2014

## 2014-12-12 ENCOUNTER — Encounter: Payer: Self-pay | Admitting: *Deleted

## 2014-12-12 NOTE — Progress Notes (Signed)
Ordered BCI per Dr. Gudena.  Faxed requisition to [facility] and informed pathology. 

## 2014-12-19 ENCOUNTER — Encounter (HOSPITAL_COMMUNITY): Payer: Self-pay

## 2015-01-09 ENCOUNTER — Other Ambulatory Visit: Payer: Self-pay | Admitting: Hematology and Oncology

## 2015-01-10 NOTE — Telephone Encounter (Signed)
Chart reviewed.

## 2015-01-30 ENCOUNTER — Ambulatory Visit (INDEPENDENT_AMBULATORY_CARE_PROVIDER_SITE_OTHER): Payer: Medicare Other | Admitting: Neurology

## 2015-01-30 ENCOUNTER — Encounter: Payer: Self-pay | Admitting: Neurology

## 2015-01-30 VITALS — BP 130/84 | HR 94 | Ht 63.0 in | Wt 157.5 lb

## 2015-01-30 DIAGNOSIS — G40209 Localization-related (focal) (partial) symptomatic epilepsy and epileptic syndromes with complex partial seizures, not intractable, without status epilepticus: Secondary | ICD-10-CM | POA: Diagnosis not present

## 2015-01-30 NOTE — Patient Instructions (Signed)
1. Continue Lamotrigine 100mg  twice a day 2. Follow-up in 1 year, call for any changes  Seizure Precautions: 1. If medication has been prescribed for you to prevent seizures, take it exactly as directed.  Do not stop taking the medicine without talking to your doctor first, even if you have not had a seizure in a long time.   2. Avoid activities in which a seizure would cause danger to yourself or to others.  Don't operate dangerous machinery, swim alone, or climb in high or dangerous places, such as on ladders, roofs, or girders.  Do not drive unless your doctor says you may.  3. If you have any warning that you may have a seizure, lay down in a safe place where you can't hurt yourself.    4.  No driving for 6 months from last seizure, as per [facility].   Please refer to the following link on the [place] website for more information: http://[email or web address]/answerplace/Social/driving/drivingu.cfm    5.  Maintain good sleep hygiene. Avoid alcohol.  6.  Contact your doctor if you have any problems that may be related to the medicine you are taking.  7.  Call 911 and bring the patient back to the ED if:        A.  The seizure lasts longer than 5 minutes.       B.  The patient doesn't awaken shortly after the seizure  C.  The patient has new problems such as difficulty seeing, speaking or moving  D.  The patient was injured during the seizure  E.  The patient has a temperature over 102 F (39C)  F.  The patient vomited and now is having trouble breathing

## 2015-01-30 NOTE — Progress Notes (Signed)
NEUROLOGY FOLLOW UP OFFICE NOTE  Julie [name] [id number]  HISTORY OF PRESENT ILLNESS: I had the pleasure of seeing Julie [name] in follow-up in the neurology clinic on 01/30/2015.  The patient was last seen 3 months ago after an episode of behavioral arrest with right head turn last 06/15/14. This was preceded by brief episodes of feeling unsteady and nauseated the day prior and the morning of the episode. She denies any similar symptoms since then.She is tolerating Lamotrigine 100mg  BID with no side effects. She denies any headaches, dizziness, focal numbness/tingling/weakness, olfactory/gustatory hallucinations, myoclonic jerks. No falls. She is back to driving.  HPI: This is a pleasant 70 yo RH woman with a history of hypertension, hyperlipidemia, right breast cancer s/p lumpectomy and radiation 4-1/2 years ago, who had an episode of spacing out last 06/15/2014. She recalls having an episode the day prior where she felt unsteady and nauseated ("in my head, not my stomach") lasting for a few seconds. The next day, she had 2 similar brief spells, then at dinnertime, she was witnessed by her daughter to have behavioral arrest, looking off to her right side with a blank look, then her hands flexed to her chest with mild shaking. She was unresponsive to family. This lasted 5-7 seconds, then she came to amnestic of the event, wondering why people were asking her questions. She denied feeling confusion, no focal numbness/tingling/weakness. She did not feel the similar dizziness prior to this episode. She denied any sleep deprivation or alcohol. No further episodes of behavioral arrest since then, however she had another brief episode of similar dizziness with nausea lasting only a few seconds last 09/02/14. She denies any gaps in time, olfactory/gustatory hallucinations, deja vu, focal numbness/tingling/weakness, myoclonic jerks. Since radiation therapy, her right hand would occasionally get jerky. She has  infrequent headaches that are brief with no associated nausea/vomiting/photo or phonophobia, no diplopia, dysarthria, dysphagia, neck/back pain, bowel/bladder dysfunction. She has had some memory changes, mostly for names and conversations, she denies getting lost driving. She is in charge of bills and denies any missed payments or medications. There is a strong family history of Alzheimer's disease in her mother, father, paternal and maternal aunts and uncles and grandparents.   Epilepsy Risk Factors: She had a normal birth and early development. There is no history of febrile convulsions, CNS infections such as meningitis/encephalitis, significant traumatic brain injury, neurosurgical procedures, or family history of seizures.  Diagnostic Data: MRI brain with and without contrast which showed mild diffuse atrophy and extensive chronic microvascular disease in the periventricular and subcortical regions, no acute findings. There was concern for possible aneurysm, that was not detected on follow-up MRA, with signal loss felt to be due to vascular calcification, irregularity consistent with atherosclerotic disease. Her routine wake and sleep EEG was normal. 24-hour EEG which was mildly abnormal due to mild diffuse slowing of the waking background, at times sharply contoured over the bilateral temporal regions, consistent with wicket spikes. There were no clear epileptiform discharges seen. Typical events were not captured.  PAST MEDICAL HISTORY: Past Medical History  Diagnosis Date  . Nipple discharge     right-all resolved after surgery  . Colon polyp   . Hypertension   . Cancer ([place])     4'12-breast rt; lumpectomy,radiation/Dr. Chancy Milroy, oncology  . Skin change 07-21-12    right abdomen LQ-due to past hx. Heparin injections  . Renal colic     MEDICATIONS: Current Outpatient Prescriptions on File Prior to Visit  Medication Sig  Dispense Refill  . alendronate (FOSAMAX) 70 MG tablet Take 70 mg by  mouth once a week. Take with a full glass of water on an empty stomach.    Marland Kitchen anastrozole (ARIMIDEX) 1 MG tablet TAKE 1 BY MOUTH DAILY 90 tablet 3  . aspirin EC 81 MG tablet Take 81 mg by mouth daily.    Marland Kitchen atorvastatin (LIPITOR) 10 MG tablet Take 10 mg by mouth every evening.     . Calcium Carbonate-Vitamin D (CALCIUM + D) 600-200 MG-UNIT TABS Take 1 tablet by mouth daily.     Marland Kitchen lamoTRIgine (LAMICTAL) 100 MG tablet Take 1 tablet twice a day 60 tablet 6  . losartan (COZAAR) 25 MG tablet Take 25 mg by mouth every morning.     No current facility-administered medications on file prior to visit.    ALLERGIES: No Known Allergies  FAMILY HISTORY: Family History  Problem Relation Age of Onset  . Hypertension Mother   . Hyperlipidemia Mother   . Hypertension Father   . Hyperlipidemia Father   . Alzheimer's disease Father     SOCIAL HISTORY: Social History   Social History  . Marital Status: Married    Spouse Name: N/A  . Number of Children: 4  . Years of Education: N/A   Occupational History  . Bookeeper    Social History Main Topics  . Smoking status: Former Smoker    Types: Cigarettes    Quit date: 07/21/1968  . Smokeless tobacco: Never Used  . Alcohol Use: No  . Drug Use: No  . Sexual Activity: Yes   Other Topics Concern  . Not on file   Social History Narrative    REVIEW OF SYSTEMS: Constitutional: No fevers, chills, or sweats, no generalized fatigue, change in appetite Eyes: No visual changes, double vision, eye pain Ear, nose and throat: No hearing loss, ear pain, nasal congestion, sore throat Cardiovascular: No chest pain, palpitations Respiratory:  No shortness of breath at rest or with exertion, wheezes GastrointestinaI: No nausea, vomiting, diarrhea, abdominal pain, fecal incontinence Genitourinary:  No dysuria, urinary retention or frequency Musculoskeletal:  No neck pain, back pain Integumentary: No rash, pruritus, skin lesions Neurological: as  above Psychiatric: No depression, insomnia, anxiety Endocrine: No palpitations, fatigue, diaphoresis, mood swings, change in appetite, change in weight, increased thirst Hematologic/Lymphatic:  No anemia, purpura, petechiae. Allergic/Immunologic: no itchy/runny eyes, nasal congestion, recent allergic reactions, rashes  PHYSICAL EXAM: Filed Vitals:   01/30/15 1500  BP: 130/84  Pulse: 94   General: No acute distress Head:  Normocephalic/atraumatic Neck: supple, no paraspinal tenderness, full range of motion Heart:  Regular rate and rhythm Lungs:  Clear to auscultation bilaterally Back: No paraspinal tenderness Skin/Extremities: No rash, no edema Neurological Exam: alert and oriented to person, place, and time. No aphasia or dysarthria. Fund of knowledge is appropriate.  Recent and remote memory are intact. 3/3 delayed recall.  Attention and concentration are normal.    Able to name objects and repeat phrases. Cranial nerves: Pupils equal, round, reactive to light.  Extraocular movements intact with no nystagmus. Visual fields full. Facial sensation intact. No facial asymmetry. Tongue, uvula, palate midline.  Motor: Bulk and tone normal, muscle strength 5/5 throughout with no pronator drift.  Sensation to light touch intact.  No extinction to double simultaneous stimulation.  Deep tendon reflexes 2+ throughout, toes downgoing.  Finger to nose testing intact.  Gait narrow-based and steady, able to tandem walk adequately.  Romberg negative.  IMPRESSION: This is a pleasant 69  yo RH woman with a history of hypertension, hyperlipidemia, right breast cancer s/p lumpectomy and radiation 4-1/2 years ago, who had an episode of behavioral arrest with unresponsiveness and right head turn, brief shaking last 06/15/2014, suggestive of complex partial seizure. MRI brain shows extensive white matter disease, routine EEG normal. Her 24-hour EEG did not show any epileptiform discharges, there was non-specific mild  diffuse background slowing. She continues to do well with no further similar symptoms, including the unsteadiness and nausea she felt the morning of and day prior to the event. Although her EEG did not show any epileptiform discharges, the episodes are concerning for partial seizures. Continue Lamotrigine 100mg  BID for now. She is aware of [place] driving restrictions which indicate a patient needs to free of seizures or events of altered awareness for 6 months prior to resuming driving. She will be switching pharmacies next month and will call our office to let us know where to send 1-year refills to. She will follow-up in 1 year and knows to call our office for any changes in the interim.  Thank you for allowing me to participate in her care.  Please do not hesitate to call for any questions or concerns.  The duration of this appointment visit was 15 minutes of face-to-face time with the patient.  Greater than 50% of this time was spent in counseling, explanation of diagnosis, planning of further management, and coordination of care.   Ellouise Newer, M.D.   CC: Dr. Percell Miller

## 2015-01-30 NOTE — Progress Notes (Signed)
Note routed

## 2015-01-31 ENCOUNTER — Encounter (HOSPITAL_COMMUNITY): Payer: Self-pay

## 2015-06-07 ENCOUNTER — Encounter: Payer: Self-pay | Admitting: Family Medicine

## 2015-06-10 ENCOUNTER — Other Ambulatory Visit: Payer: Self-pay | Admitting: Neurology

## 2015-06-11 NOTE — Telephone Encounter (Signed)
Last OV: 01/30/15 Next OV: 01/30/16 Continue Lamotrigine 100mg  BID for now.

## 2015-06-18 ENCOUNTER — Other Ambulatory Visit: Payer: Self-pay | Admitting: Neurology

## 2015-06-18 NOTE — Telephone Encounter (Signed)
Lamictal refill requested. Per last office note- patient to remain on medication. Refill approved and sent to patient's pharmacy.

## 2015-06-29 ENCOUNTER — Encounter: Payer: Self-pay | Admitting: Hematology and Oncology

## 2015-07-03 ENCOUNTER — Encounter: Payer: Self-pay | Admitting: Hematology and Oncology

## 2015-12-05 NOTE — Assessment & Plan Note (Deleted)
Right breast invasive ductal carcinoma grade 1, 0.8 cm and DCIS, 2 SLN negative, T1 B. N0 M0 stage IA, status post lumpectomy and radiation and is currently on Arimidex started August 2012  Arimidex related toxicities: patient does not have any side effects to Arimidex.   BCI: High risk 5.1%, low likelihood of benefit from ext adj HT  Breast Cancer Surveillance: 1. Breast exam 12/06/15: Normal 2. Mammogram 05/29/15 No abnormalities. Postsurgical changes. Breast Density Category A. I recommended that she get 3-D mammograms for surveillance. Discussed the differences between different breast density categories.  3. Bone density 05/29/15: T score -2.1  Return to clinic in 1 year for follow-up

## 2015-12-06 ENCOUNTER — Ambulatory Visit: Payer: Medicare Other | Admitting: Hematology and Oncology

## 2015-12-06 NOTE — Progress Notes (Deleted)
Patient Care Team: Carlos Levering, PA-C as PCP - General (Family Medicine)  DIAGNOSIS:  Encounter Diagnosis  Name Primary?  . Malignant neoplasm of lower-inner quadrant of right breast of female, estrogen receptor positive ([place])     SUMMARY OF ONCOLOGIC HISTORY:   Breast cancer of lower-inner quadrant of right female breast ([place])   05/01/2010 Initial Diagnosis    Right breast mass: Invasive mammary cancer ER 100% PR 60% HER-2 negative Ki-67 10% MRI showed a 6 mm nodule upper-outer quadrant right breast      05/16/2010 Surgery    Right breast lumpectomy: Invasive ductal carcinoma grade 1, 0.8 cm and DCIS, 2 SLN negative T1 B. N0 M0 stage IA      06/12/2010 - 12/04/2010 Radiation Therapy    Adjuvant radiation therapy      09/05/2010 -  Anti-estrogen oral therapy    Arimidex 1 mg daily plan is for 10 years      12/19/2014 Pathology Results    Breast cancer index: High risk of recurrence, high likelihood of benefit from extended adjuvant therapy.       CHIEF COMPLIANT: Follow-up on Arimidex therapy  INTERVAL HISTORY: Julie [name] is a 70 year old with above-mentioned history of right breast cancer treated with lumpectomy adjuvant radiation is currently on Arimidex therapy since 2012. Patient is on currently on extended adjuvant therapy primarily because of breast cancer index test suggests that she has high risk of recurrence and high likelihood of benefit from extended therapy. She is tolerating it extremely well without any side effects. Bone density test done earlier this year revealed a T score of -2.1 suggestive of osteopenia. She denies any lumps or nodules in the breasts.  REVIEW OF SYSTEMS:   Constitutional: Denies fevers, chills or abnormal weight loss Eyes: Denies blurriness of vision Ears, nose, mouth, throat, and face: Denies mucositis or sore throat Respiratory: Denies cough, dyspnea or wheezes Cardiovascular: Denies palpitation, chest  discomfort Gastrointestinal:  Denies nausea, heartburn or change in bowel habits Skin: Denies abnormal skin rashes Lymphatics: Denies new lymphadenopathy or easy bruising Neurological:Denies numbness, tingling or new weaknesses Behavioral/Psych: Mood is stable, no new changes  Extremities: No lower extremity edema Breast:  denies any pain or lumps or nodules in either breasts All other systems were reviewed with the patient and are negative.  I have reviewed the past medical history, past surgical history, social history and family history with the patient and they are unchanged from previous note.  ALLERGIES:  has No Known Allergies.  MEDICATIONS:  Current Outpatient Prescriptions  Medication Sig Dispense Refill  . alendronate (FOSAMAX) 70 MG tablet Take 70 mg by mouth once a week. Take with a full glass of water on an empty stomach.    Marland Kitchen anastrozole (ARIMIDEX) 1 MG tablet TAKE 1 BY MOUTH DAILY 90 tablet 3  . aspirin EC 81 MG tablet Take 81 mg by mouth daily.    Marland Kitchen atorvastatin (LIPITOR) 10 MG tablet Take 10 mg by mouth every evening.     . Calcium Carbonate-Vitamin D (CALCIUM + D) 600-200 MG-UNIT TABS Take 1 tablet by mouth daily.     Marland Kitchen lamoTRIgine (LAMICTAL) 100 MG tablet TAKE 1 TABLET TWICE A DAY 60 tablet 6  . lamoTRIgine (LAMICTAL) 100 MG tablet TAKE 1 TABLET TWICE A DAY 60 tablet 7  . losartan (COZAAR) 25 MG tablet Take 25 mg by mouth every morning.     No current facility-administered medications for this visit.     PHYSICAL EXAMINATION: ECOG  PERFORMANCE STATUS: 1 - Symptomatic but completely ambulatory  There were no vitals filed for this visit. There were no vitals filed for this visit.  GENERAL:alert, no distress and comfortable SKIN: skin color, texture, turgor are normal, no rashes or significant lesions EYES: normal, Conjunctiva are pink and non-injected, sclera clear OROPHARYNX:no exudate, no erythema and lips, buccal mucosa, and tongue normal  NECK: supple,  thyroid normal size, non-tender, without nodularity LYMPH:  no palpable lymphadenopathy in the cervical, axillary or inguinal LUNGS: clear to auscultation and percussion with normal breathing effort HEART: regular rate & rhythm and no murmurs and no lower extremity edema ABDOMEN:abdomen soft, non-tender and normal bowel sounds MUSCULOSKELETAL:no cyanosis of digits and no clubbing  NEURO: alert & oriented x 3 with fluent speech, no focal motor/sensory deficits EXTREMITIES: No lower extremity edema BREAST: No palpable masses or nodules in either right or left breasts. No palpable axillary supraclavicular or infraclavicular adenopathy no breast tenderness or nipple discharge. (exam performed in the presence of a chaperone)  LABORATORY DATA:  I have reviewed the data as listed   Chemistry      Component Value Date/Time   NA 140 12/07/2014 0921   K 4.0 12/07/2014 0921   CL 103 07/10/2012 0350   CL 107 04/12/2012 1349   CO2 25 12/07/2014 0921   BUN 15.8 12/07/2014 0921   CREATININE 0.9 12/07/2014 0921      Component Value Date/Time   CALCIUM 9.4 12/07/2014 0921   ALKPHOS 83 12/07/2014 0921   AST 21 12/07/2014 0921   ALT 13 12/07/2014 0921   BILITOT 1.18 12/07/2014 0921       Lab Results  Component Value Date   WBC 6.5 12/07/2014   HGB 15.1 12/07/2014   HCT 45.5 12/07/2014   MCV 92.4 12/07/2014   PLT 227 12/07/2014   NEUTROABS 4.4 12/07/2014    ASSESSMENT & PLAN:  Breast cancer of lower-inner quadrant of right female breast Right breast invasive ductal carcinoma grade 1, 0.8 cm and DCIS, 2 SLN negative, T1 B. N0 M0 stage IA, status post lumpectomy and radiation and is currently on Arimidex started August 2012  Arimidex related toxicities: patient does not have any side effects to Arimidex.   BCI: High risk 5.1%, low likelihood of benefit from ext adj HT  Breast Cancer Surveillance: 1. Breast exam 12/06/15: Normal 2. Mammogram 05/29/15 No abnormalities. Postsurgical  changes. Breast Density Category A. I recommended that she get 3-D mammograms for surveillance. Discussed the differences between different breast density categories.  3. Bone density 05/29/15: T score -2.1  Return to clinic in 1 year for follow-up   No orders of the defined types were placed in this encounter.  The patient has a good understanding of the overall plan. she agrees with it. she will call with any problems that may develop before the next visit here.   Rulon Eisenmenger, MD 12/06/15

## 2016-01-15 DIAGNOSIS — J069 Acute upper respiratory infection, unspecified: Secondary | ICD-10-CM | POA: Diagnosis not present

## 2016-01-30 ENCOUNTER — Encounter: Payer: Self-pay | Admitting: Neurology

## 2016-01-30 ENCOUNTER — Ambulatory Visit: Payer: Medicare Other | Admitting: Neurology

## 2016-01-30 ENCOUNTER — Ambulatory Visit (INDEPENDENT_AMBULATORY_CARE_PROVIDER_SITE_OTHER): Payer: PPO | Admitting: Neurology

## 2016-01-30 VITALS — BP 130/76 | HR 110 | Ht 63.0 in | Wt 160.4 lb

## 2016-01-30 DIAGNOSIS — G40209 Localization-related (focal) (partial) symptomatic epilepsy and epileptic syndromes with complex partial seizures, not intractable, without status epilepticus: Secondary | ICD-10-CM

## 2016-01-30 MED ORDER — LAMOTRIGINE 100 MG PO TABS: 100.0000 mg | ORAL_TABLET | Freq: Two times a day (BID) | ORAL | 11 refills | Status: DC

## 2016-01-30 NOTE — Patient Instructions (Signed)
It was good seeing you! 1. Continue Lamotrigine 100mg  twice a day 2. Follow-up in 1 year, call for any changes  Seizure Precautions: 1. If medication has been prescribed for you to prevent seizures, take it exactly as directed.  Do not stop taking the medicine without talking to your doctor first, even if you have not had a seizure in a long time.   2. Avoid activities in which a seizure would cause danger to yourself or to others.  Don't operate dangerous machinery, swim alone, or climb in high or dangerous places, such as on ladders, roofs, or girders.  Do not drive unless your doctor says you may.  3. If you have any warning that you may have a seizure, lay down in a safe place where you can't hurt yourself.    4.  No driving for 6 months from last seizure, as per [facility].   Please refer to the following link on the [place] website for more information: http://[email or web address].org/[email or web address]/driving/drivingu.cfm   5.  Maintain good sleep hygiene. Avoid alcohol.  6.  Contact your doctor if you have any problems that may be related to the medicine you are taking.  7.  Call 911 and bring the patient back to the ED if:        A.  The seizure lasts longer than 5 minutes.       B.  The patient doesn't awaken shortly after the seizure  C.  The patient has new problems such as difficulty seeing, speaking or moving  D.  The patient was injured during the seizure  E.  The patient has a temperature over 102 F (39C)  F.  The patient vomited and now is having trouble breathing

## 2016-01-30 NOTE — Progress Notes (Signed)
NEUROLOGY FOLLOW UP OFFICE NOTE  Julie [name] [id number]  HISTORY OF PRESENT ILLNESS: I had the pleasure of seeing Julie [name] in follow-up in the neurology clinic on 01/30/2016.  The patient was last seen a year ago after an episode of behavioral arrest with right head turn last 06/15/14. This was preceded by brief episodes of feeling unsteady and nauseated the day prior and the morning of the episode. She denies any similar symptoms since then. She continues to do well on Lamotrigine 100mg  BID with no side effects. She denies any headaches, dizziness, focal numbness/tingling/weakness, olfactory/gustatory hallucinations, myoclonic jerks. No falls.   HPI: This is a pleasant 71 yo RH woman with a history of hypertension, hyperlipidemia, right breast cancer s/p lumpectomy and radiation 4-1/2 years ago, who had an episode of spacing out last 06/15/2014. She recalls having an episode the day prior where she felt unsteady and nauseated ("in my head, not my stomach") lasting for a few seconds. The next day, she had 2 similar brief spells, then at dinnertime, she was witnessed by her daughter to have behavioral arrest, looking off to her right side with a blank look, then her hands flexed to her chest with mild shaking. She was unresponsive to family. This lasted 5-7 seconds, then she came to amnestic of the event, wondering why people were asking her questions. She denied feeling confusion, no focal numbness/tingling/weakness. She did not feel the similar dizziness prior to this episode. She denied any sleep deprivation or alcohol. No further episodes of behavioral arrest since then, however she had another brief episode of similar dizziness with nausea lasting only a few seconds last 09/02/14. She denies any gaps in time, olfactory/gustatory hallucinations, deja vu, focal numbness/tingling/weakness, myoclonic jerks. Since radiation therapy, her right hand would occasionally get jerky. She has infrequent  headaches that are brief with no associated nausea/vomiting/photo or phonophobia, no diplopia, dysarthria, dysphagia, neck/back pain, bowel/bladder dysfunction. She has had some memory changes, mostly for names and conversations, she denies getting lost driving. She is in charge of bills and denies any missed payments or medications. There is a strong family history of Alzheimer's disease in her mother, father, paternal and maternal aunts and uncles and grandparents.   Epilepsy Risk Factors: She had a normal birth and early development. There is no history of febrile convulsions, CNS infections such as meningitis/encephalitis, significant traumatic brain injury, neurosurgical procedures, or family history of seizures.  Diagnostic Data: MRI brain with and without contrast which showed mild diffuse atrophy and extensive chronic microvascular disease in the periventricular and subcortical regions, no acute findings. There was concern for possible aneurysm, that was not detected on follow-up MRA, with signal loss felt to be due to vascular calcification, irregularity consistent with atherosclerotic disease. Her routine wake and sleep EEG was normal. 24-hour EEG which was mildly abnormal due to mild diffuse slowing of the waking background, at times sharply contoured over the bilateral temporal regions, consistent with wicket spikes. There were no clear epileptiform discharges seen. Typical events were not captured.  PAST MEDICAL HISTORY: Past Medical History:  Diagnosis Date  . Cancer ([place])    4'12-breast rt; lumpectomy,radiation/Dr. Chancy Milroy, oncology  . Colon polyp   . Hypertension   . Nipple discharge    right-all resolved after surgery  . Renal colic   . Skin change 07-21-12   right abdomen LQ-due to past hx. Heparin injections    MEDICATIONS: Current Outpatient Prescriptions on File Prior to Visit  Medication Sig Dispense Refill  .  alendronate (FOSAMAX) 70 MG tablet Take 70 mg by mouth once a  week. Take with a full glass of water on an empty stomach.    Marland Kitchen aspirin EC 81 MG tablet Take 81 mg by mouth daily.    Marland Kitchen atorvastatin (LIPITOR) 10 MG tablet Take 10 mg by mouth every evening.     . Calcium Carbonate-Vitamin D (CALCIUM + D) 600-200 MG-UNIT TABS Take 1 tablet by mouth daily.     Marland Kitchen lamoTRIgine (LAMICTAL) 100 MG tablet TAKE 1 TABLET TWICE A DAY 60 tablet 6  . lamoTRIgine (LAMICTAL) 100 MG tablet TAKE 1 TABLET TWICE A DAY 60 tablet 7  . losartan (COZAAR) 25 MG tablet Take 25 mg by mouth every morning.    Marland Kitchen anastrozole (ARIMIDEX) 1 MG tablet TAKE 1 BY MOUTH DAILY (Patient not taking: Reported on 01/30/2016) 90 tablet 3   No current facility-administered medications on file prior to visit.     ALLERGIES: No Known Allergies  FAMILY HISTORY: Family History  Problem Relation Age of Onset  . Hypertension Mother   . Hyperlipidemia Mother   . Hypertension Father   . Hyperlipidemia Father   . Alzheimer's disease Father     SOCIAL HISTORY: Social History   Social History  . Marital status: Married    Spouse name: N/A  . Number of children: 4  . Years of education: N/A   Occupational History  . Bookeeper    Social History Main Topics  . Smoking status: Former Smoker    Types: Cigarettes    Quit date: 07/21/1968  . Smokeless tobacco: Never Used  . Alcohol use No  . Drug use: No  . Sexual activity: Yes   Other Topics Concern  . Not on file   Social History Narrative  . No narrative on file    REVIEW OF SYSTEMS: Constitutional: No fevers, chills, or sweats, no generalized fatigue, change in appetite Eyes: No visual changes, double vision, eye pain Ear, nose and throat: No hearing loss, ear pain, nasal congestion, sore throat Cardiovascular: No chest pain, palpitations Respiratory:  No shortness of breath at rest or with exertion, wheezes GastrointestinaI: No nausea, vomiting, diarrhea, abdominal pain, fecal incontinence Genitourinary:  No dysuria, urinary  retention or frequency Musculoskeletal:  No neck pain, back pain Integumentary: No rash, pruritus, skin lesions Neurological: as above Psychiatric: No depression, insomnia, anxiety Endocrine: No palpitations, fatigue, diaphoresis, mood swings, change in appetite, change in weight, increased thirst Hematologic/Lymphatic:  No anemia, purpura, petechiae. Allergic/Immunologic: no itchy/runny eyes, nasal congestion, recent allergic reactions, rashes  PHYSICAL EXAM: Vitals:   01/30/16 1605  BP: 130/76  Pulse: (!) 110   General: No acute distress Head:  Normocephalic/atraumatic Neck: supple, no paraspinal tenderness, full range of motion Heart:  Regular rate and rhythm Lungs:  Clear to auscultation bilaterally Back: No paraspinal tenderness Skin/Extremities: No rash, no edema Neurological Exam: alert and oriented to person, place, and time. No aphasia or dysarthria. Fund of knowledge is appropriate.  Recent and remote memory are intact. Attention and concentration are normal.    Able to name objects and repeat phrases. Cranial nerves: Pupils equal, round, reactive to light.  Extraocular movements intact with no nystagmus. Visual fields full. Facial sensation intact. No facial asymmetry. Tongue, uvula, palate midline.  Motor: Bulk and tone normal, muscle strength 5/5 throughout with no pronator drift.  Sensation to light touch intact.  No extinction to double simultaneous stimulation.  Deep tendon reflexes 2+ throughout, toes downgoing.  Finger to nose testing  intact.  Gait narrow-based and steady, able to tandem walk adequately.  Romberg negative.  IMPRESSION: This is a pleasant 71 yo RH woman with a history of hypertension, hyperlipidemia, right breast cancer s/p lumpectomy and radiation 4-1/2 years ago, who had an episode of behavioral arrest with unresponsiveness and right head turn, brief shaking last 06/15/2014, suggestive of focal seizure with impaired awareness that generalized. MRI brain  shows extensive white matter disease, routine EEG normal. Her 24-hour EEG did not show any epileptiform discharges, there was non-specific mild diffuse background slowing. She continues to do well with no further similar symptoms, including the unsteadiness and nausea she felt the morning of and day prior to the event. We discussed option to potentially start tapering off medication after 2 years of being seizure-free, understanding risks of breakthrough seizure and driving restrictions at that time. She would like to stay on Lamotrigine 100mg  BID for now. She is aware of [place] driving restrictions which indicate a patient needs to free of seizures or events of altered awareness for 6 months prior to resuming driving. She will follow-up in 1 year and knows to call our office for any changes in the interim.  Thank you for allowing me to participate in her care.  Please do not hesitate to call for any questions or concerns.  The duration of this appointment visit was 15 minutes of face-to-face time with the patient.  Greater than 50% of this time was spent in counseling, explanation of diagnosis, planning of further management, and coordination of care.   Ellouise Newer, M.D.   CC: Dr. Inda Merlin

## 2016-02-21 DIAGNOSIS — G40209 Localization-related (focal) (partial) symptomatic epilepsy and epileptic syndromes with complex partial seizures, not intractable, without status epilepticus: Secondary | ICD-10-CM | POA: Diagnosis not present

## 2016-02-21 DIAGNOSIS — E782 Mixed hyperlipidemia: Secondary | ICD-10-CM | POA: Diagnosis not present

## 2016-02-21 DIAGNOSIS — D369 Benign neoplasm, unspecified site: Secondary | ICD-10-CM | POA: Diagnosis not present

## 2016-02-21 DIAGNOSIS — C50311 Malignant neoplasm of lower-inner quadrant of right female breast: Secondary | ICD-10-CM | POA: Diagnosis not present

## 2016-02-21 DIAGNOSIS — I10 Essential (primary) hypertension: Secondary | ICD-10-CM | POA: Diagnosis not present

## 2016-02-21 DIAGNOSIS — Z0000 Encounter for general adult medical examination without abnormal findings: Secondary | ICD-10-CM | POA: Diagnosis not present

## 2016-02-21 DIAGNOSIS — M810 Age-related osteoporosis without current pathological fracture: Secondary | ICD-10-CM | POA: Diagnosis not present

## 2016-02-21 DIAGNOSIS — E559 Vitamin D deficiency, unspecified: Secondary | ICD-10-CM | POA: Diagnosis not present

## 2016-03-25 DIAGNOSIS — Z86010 Personal history of colonic polyps: Secondary | ICD-10-CM | POA: Diagnosis not present

## 2016-03-25 DIAGNOSIS — D126 Benign neoplasm of colon, unspecified: Secondary | ICD-10-CM | POA: Diagnosis not present

## 2016-03-27 DIAGNOSIS — Z86010 Personal history of colonic polyps: Secondary | ICD-10-CM | POA: Diagnosis not present

## 2016-03-27 DIAGNOSIS — D126 Benign neoplasm of colon, unspecified: Secondary | ICD-10-CM | POA: Diagnosis not present

## 2016-05-08 DIAGNOSIS — I10 Essential (primary) hypertension: Secondary | ICD-10-CM | POA: Diagnosis not present

## 2016-05-29 DIAGNOSIS — Z853 Personal history of malignant neoplasm of breast: Secondary | ICD-10-CM | POA: Diagnosis not present

## 2016-07-18 ENCOUNTER — Other Ambulatory Visit: Payer: Self-pay | Admitting: *Deleted

## 2016-07-18 NOTE — Patient Outreach (Signed)
07/16/16- 1st attempt- Telephone call to patient for follow up on [place]. No answer to telephone, voicemail states voicemail belongs to Nyra Capes, no message left. [phone]) 7/131/18-2nd attempt- Telephone call to patient for follow up on [place]. No answer to telephone at [phone], left voicemail requesting return phone call.  PLAN Attempt to reach pt next week  Jacqlyn Larsen [facility] [place], [place] Coordinator [phone]

## 2016-07-23 ENCOUNTER — Other Ambulatory Visit: Payer: Self-pay | Admitting: *Deleted

## 2016-07-23 ENCOUNTER — Encounter: Payer: Self-pay | Admitting: *Deleted

## 2016-07-23 NOTE — Patient Outreach (Signed)
Telephone call to patient for follow up on [place]. Spoke with pt, HIPAA verified, pt reports hypertension well controlled, has medications and taking as prescribed, able to afford medications, sees primary MD regularly and as needed.  Pt states she has no needs,  RN CM mailed successful outreach letter to patient's home.  Case closed.  Jacqlyn Larsen [facility], [place] Coordinator [phone]

## 2016-08-07 DIAGNOSIS — R011 Cardiac murmur, unspecified: Secondary | ICD-10-CM | POA: Diagnosis not present

## 2016-08-07 DIAGNOSIS — I10 Essential (primary) hypertension: Secondary | ICD-10-CM | POA: Diagnosis not present

## 2016-08-07 DIAGNOSIS — R42 Dizziness and giddiness: Secondary | ICD-10-CM | POA: Diagnosis not present

## 2016-08-11 ENCOUNTER — Other Ambulatory Visit: Payer: Self-pay | Admitting: Family Medicine

## 2016-08-11 DIAGNOSIS — R011 Cardiac murmur, unspecified: Secondary | ICD-10-CM

## 2016-08-15 ENCOUNTER — Other Ambulatory Visit: Payer: Self-pay

## 2016-08-15 ENCOUNTER — Ambulatory Visit (HOSPITAL_COMMUNITY): Payer: PPO | Attending: Internal Medicine

## 2016-08-15 DIAGNOSIS — I082 Rheumatic disorders of both aortic and tricuspid valves: Secondary | ICD-10-CM | POA: Diagnosis not present

## 2016-08-15 DIAGNOSIS — R011 Cardiac murmur, unspecified: Secondary | ICD-10-CM | POA: Diagnosis not present

## 2016-09-12 ENCOUNTER — Other Ambulatory Visit: Payer: Self-pay | Admitting: Neurology

## 2016-10-10 DIAGNOSIS — Z23 Encounter for immunization: Secondary | ICD-10-CM | POA: Diagnosis not present

## 2017-01-28 ENCOUNTER — Ambulatory Visit: Payer: PPO | Admitting: Neurology

## 2017-02-01 IMAGING — MR MR MRA HEAD W/O CM
1 series · 23 of 48 positions shown · non-contrast
Comparison: MRI 07/03/2014

CLINICAL DATA: Recent episode of nausea and dizziness. Mental
status changes. Abnormal MRI 07/03/2014. Suggestion of right PICA
aneurysm.

EXAM:
MRA HEAD WITHOUT CONTRAST
TECHNIQUE: Angiographic images of the [place] were obtained using MRA
technique without intravenous contrast.

[Series 4: tof_3d_multi-slab new · axial · 0.7mm · 0.35mm/px · z∈[-90,+32]mm · 23 of 182 slices shown]
[im 1/182]
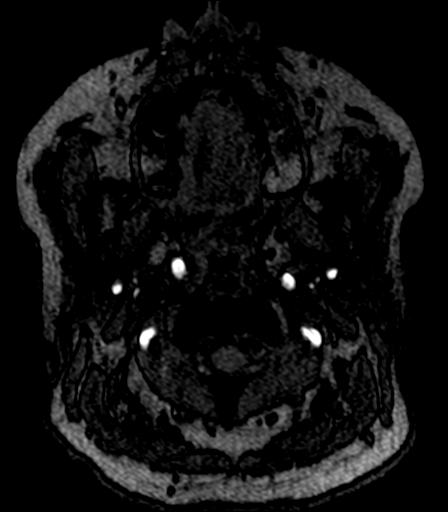
[im 4/182]
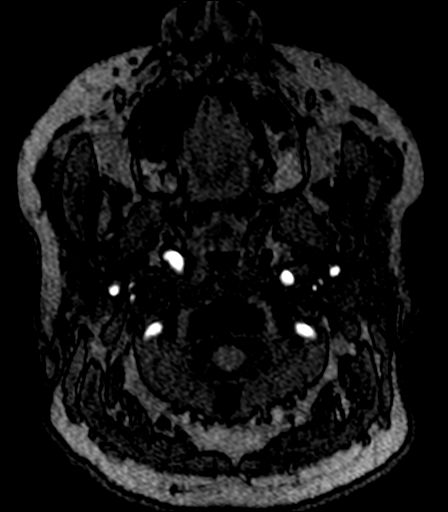
[im 8/182]
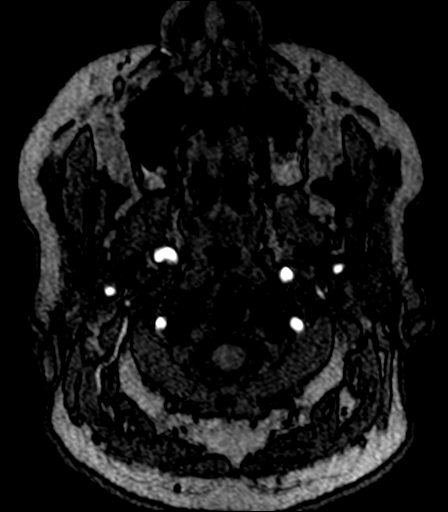
[im 12/182]
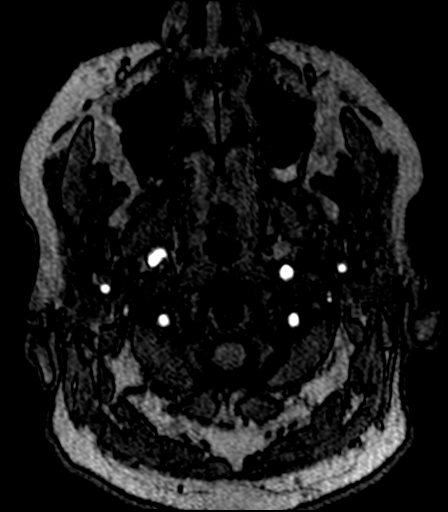
[im 16/182]
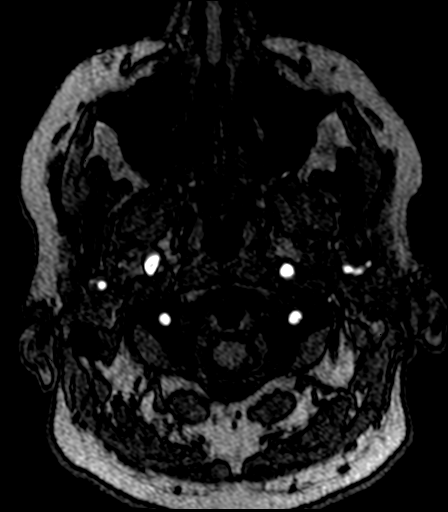
[im 20/182]
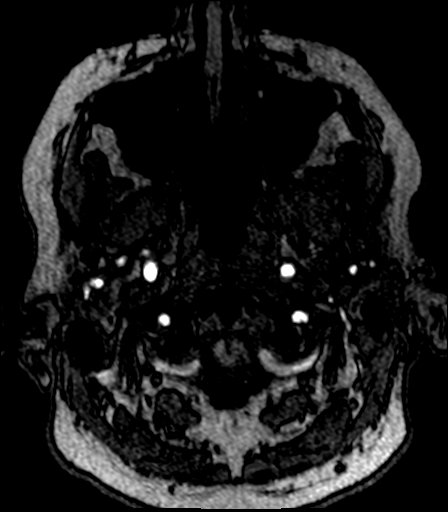
[im 24/182]
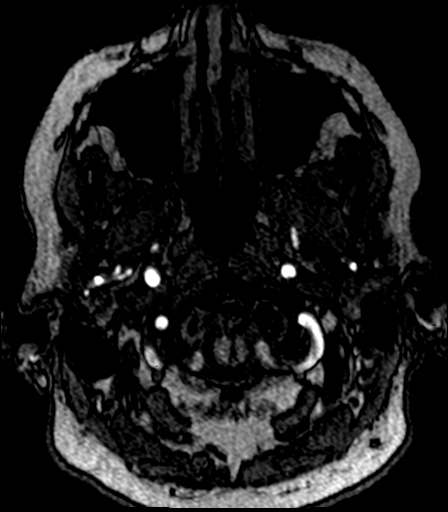
[im 27/182]
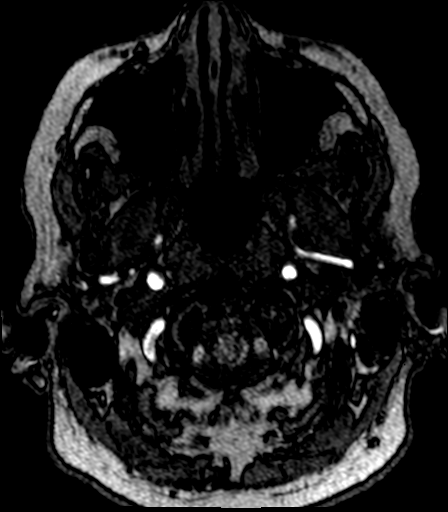
[im 31/182]
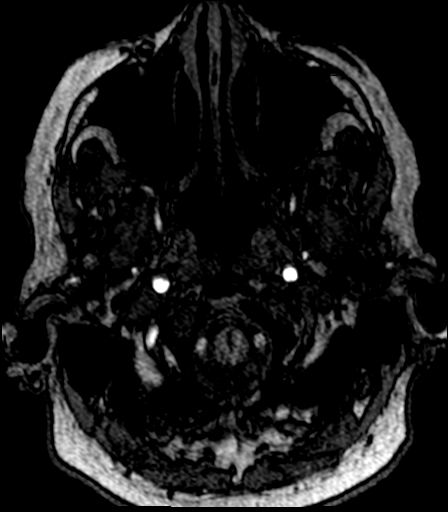
[im 35/182]
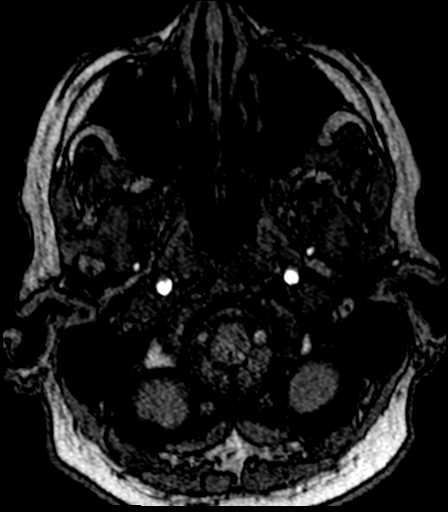
[im 39/182]
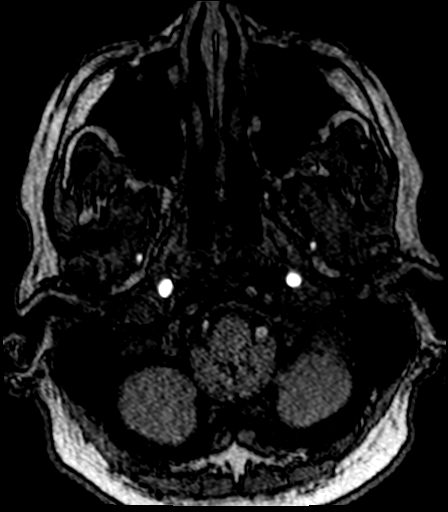
[im 43/182]
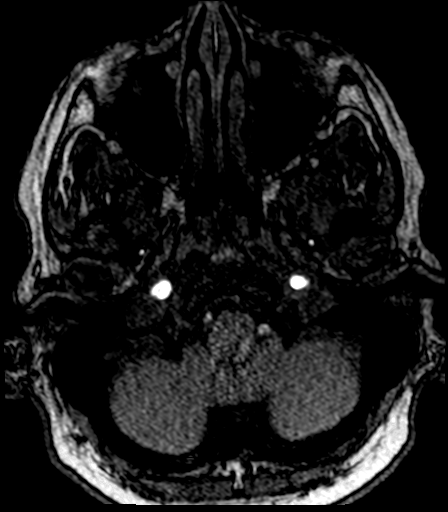
[im 47/182]
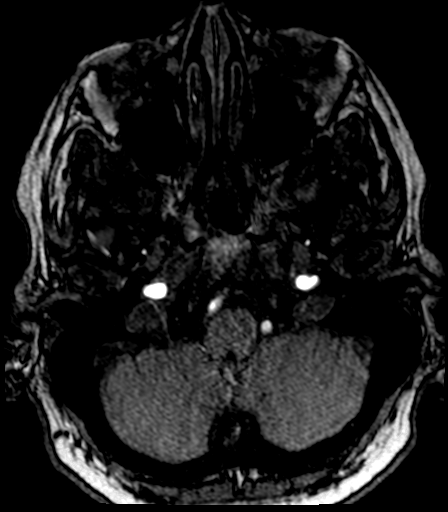
[im 51/182]
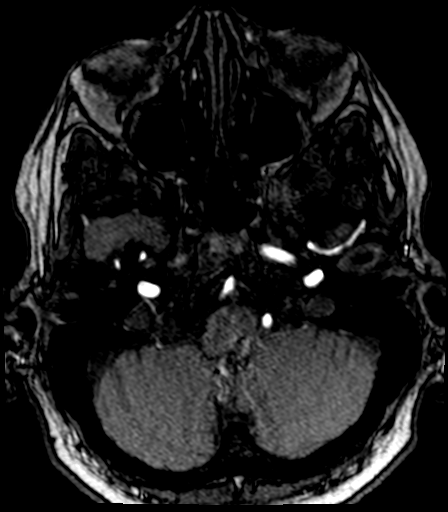
[im 54/182]
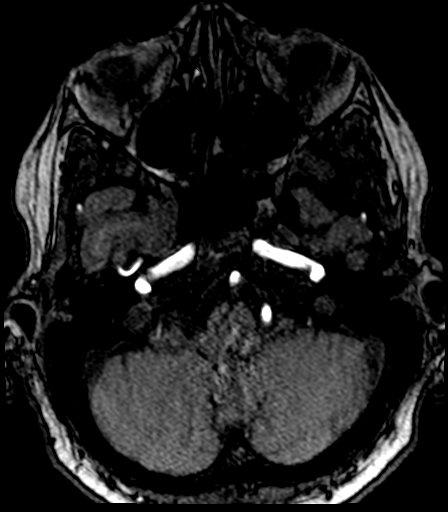
[im 58/182]
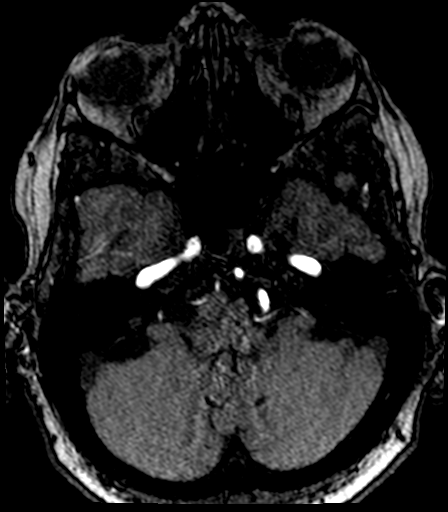
[im 81/182]
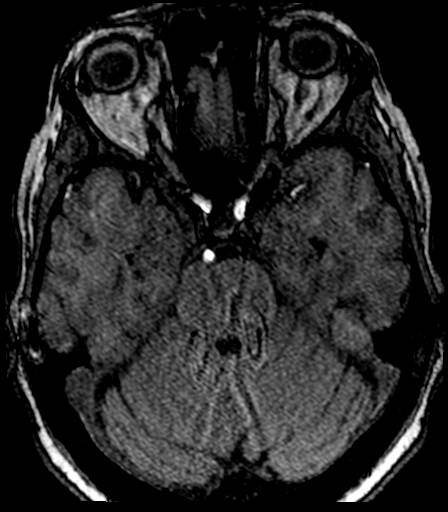
[im 93/182]
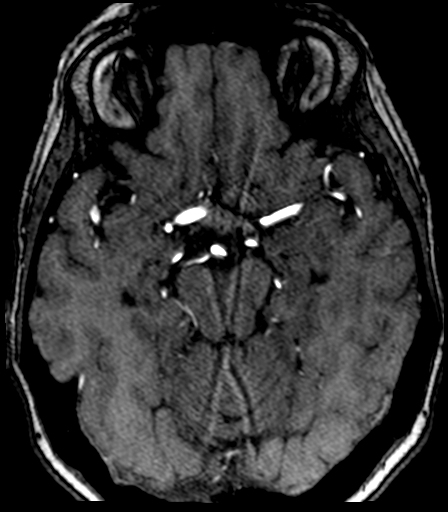
[im 104/182]
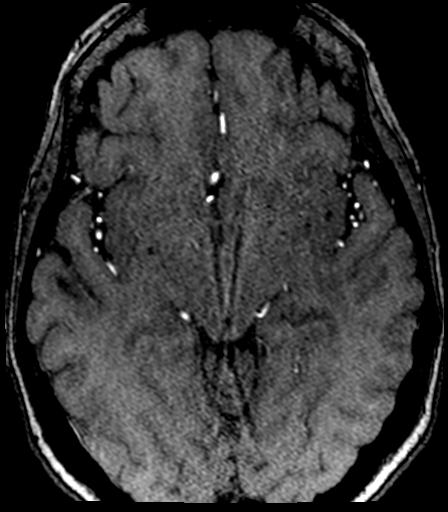
[im 128/182]
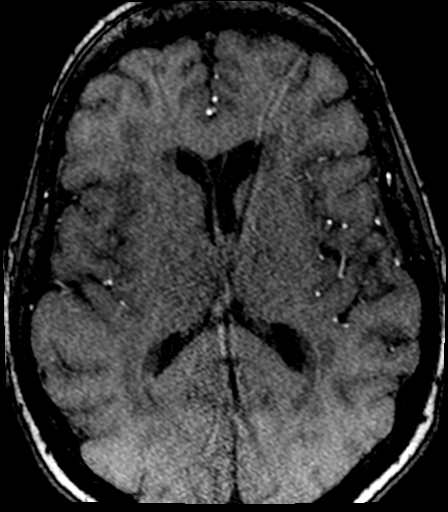
[im 151/182]
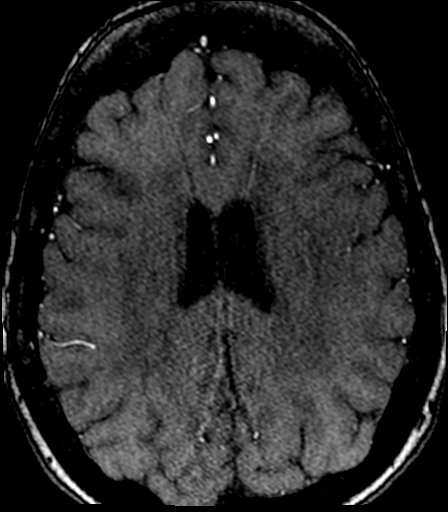
[im 155/182]
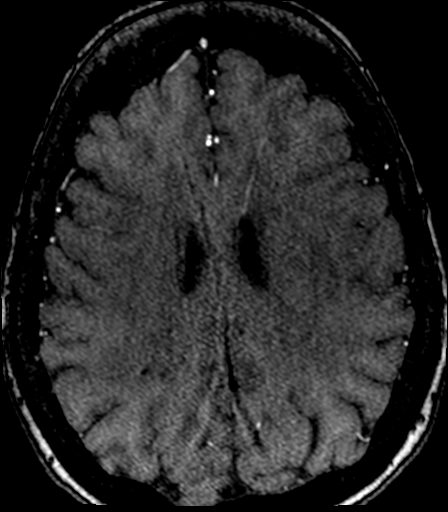
[im 174/182]
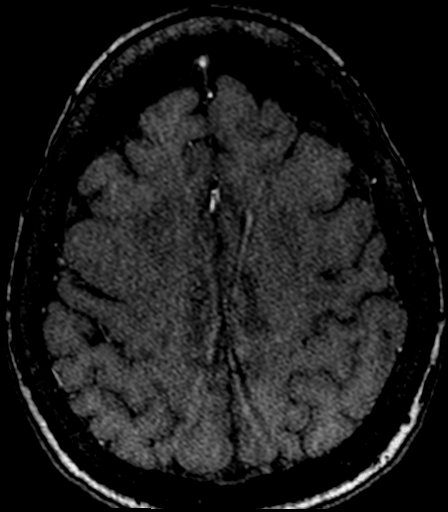

[23 of 48 positions shown; findings below may reference images not displayed]

FINDINGS: Both internal carotid arteries are patent into the brain. There is
mild narrowing in the siphon regions bilaterally without suspicion
of flow-limiting stenosis. The anterior and middle cerebral vessels
are patent without proximal stenosis, aneurysm or vascular
malformation. More distal branch vessels show mild atherosclerotic
irregularity.

Both vertebral arteries are patent to the basilar with the left
being dominant. There is mild narrowing and irregularity of the
intradural portion of the right vertebral artery probably due to
atherosclerotic disease. This could also explain the signal loss on
diffusion imaging if there is vascular calcification in this level.
No basilar stenosis. Posterior circulation branch vessels are patent
without proximal stenosis. More distal branch vessels show mild
atherosclerotic irregularity.
IMPRESSION: No evidence of aneurysm. I think the signal loss in the region of
the right vertebral artery may be due to vascular calcification.
There is narrowing and irregularity of the vessel at this level most
consistent with atherosclerotic disease.

No other major vessel significant narrowing. Mild distal vessel
irregularity.

No other evidence of aneurysm or vascular malformation.

## 2017-03-05 DIAGNOSIS — M810 Age-related osteoporosis without current pathological fracture: Secondary | ICD-10-CM | POA: Diagnosis not present

## 2017-03-05 DIAGNOSIS — D649 Anemia, unspecified: Secondary | ICD-10-CM | POA: Diagnosis not present

## 2017-03-05 DIAGNOSIS — E559 Vitamin D deficiency, unspecified: Secondary | ICD-10-CM | POA: Diagnosis not present

## 2017-03-05 DIAGNOSIS — I10 Essential (primary) hypertension: Secondary | ICD-10-CM | POA: Diagnosis not present

## 2017-03-05 DIAGNOSIS — Z1159 Encounter for screening for other viral diseases: Secondary | ICD-10-CM | POA: Diagnosis not present

## 2017-03-05 DIAGNOSIS — E782 Mixed hyperlipidemia: Secondary | ICD-10-CM | POA: Diagnosis not present

## 2017-03-05 DIAGNOSIS — I350 Nonrheumatic aortic (valve) stenosis: Secondary | ICD-10-CM | POA: Diagnosis not present

## 2017-03-05 DIAGNOSIS — Z0000 Encounter for general adult medical examination without abnormal findings: Secondary | ICD-10-CM | POA: Diagnosis not present

## 2017-03-18 DIAGNOSIS — J209 Acute bronchitis, unspecified: Secondary | ICD-10-CM | POA: Diagnosis not present

## 2017-06-02 DIAGNOSIS — Z853 Personal history of malignant neoplasm of breast: Secondary | ICD-10-CM | POA: Diagnosis not present

## 2017-06-02 DIAGNOSIS — M8589 Other specified disorders of bone density and structure, multiple sites: Secondary | ICD-10-CM | POA: Diagnosis not present

## 2017-09-16 DIAGNOSIS — E782 Mixed hyperlipidemia: Secondary | ICD-10-CM | POA: Diagnosis not present

## 2017-09-16 DIAGNOSIS — I350 Nonrheumatic aortic (valve) stenosis: Secondary | ICD-10-CM | POA: Diagnosis not present

## 2017-09-16 DIAGNOSIS — M810 Age-related osteoporosis without current pathological fracture: Secondary | ICD-10-CM | POA: Diagnosis not present

## 2017-09-16 DIAGNOSIS — N8110 Cystocele, unspecified: Secondary | ICD-10-CM | POA: Diagnosis not present

## 2017-09-16 DIAGNOSIS — I10 Essential (primary) hypertension: Secondary | ICD-10-CM | POA: Diagnosis not present

## 2017-09-16 DIAGNOSIS — E559 Vitamin D deficiency, unspecified: Secondary | ICD-10-CM | POA: Diagnosis not present

## 2017-09-30 DIAGNOSIS — N8110 Cystocele, unspecified: Secondary | ICD-10-CM | POA: Diagnosis not present

## 2017-09-30 DIAGNOSIS — N814 Uterovaginal prolapse, unspecified: Secondary | ICD-10-CM | POA: Diagnosis not present

## 2017-09-30 DIAGNOSIS — R3915 Urgency of urination: Secondary | ICD-10-CM | POA: Diagnosis not present

## 2017-10-09 DIAGNOSIS — Z23 Encounter for immunization: Secondary | ICD-10-CM | POA: Diagnosis not present

## 2017-12-02 DIAGNOSIS — N816 Rectocele: Secondary | ICD-10-CM | POA: Diagnosis not present

## 2017-12-02 DIAGNOSIS — N952 Postmenopausal atrophic vaginitis: Secondary | ICD-10-CM | POA: Diagnosis not present

## 2017-12-02 DIAGNOSIS — N8111 Cystocele, midline: Secondary | ICD-10-CM | POA: Diagnosis not present

## 2017-12-02 DIAGNOSIS — R339 Retention of urine, unspecified: Secondary | ICD-10-CM | POA: Diagnosis not present

## 2017-12-02 DIAGNOSIS — N812 Incomplete uterovaginal prolapse: Secondary | ICD-10-CM | POA: Diagnosis not present

## 2017-12-23 DIAGNOSIS — N393 Stress incontinence (female) (male): Secondary | ICD-10-CM | POA: Diagnosis not present

## 2017-12-23 DIAGNOSIS — N812 Incomplete uterovaginal prolapse: Secondary | ICD-10-CM | POA: Diagnosis not present

## 2017-12-23 DIAGNOSIS — Z4689 Encounter for fitting and adjustment of other specified devices: Secondary | ICD-10-CM | POA: Diagnosis not present

## 2018-03-03 DIAGNOSIS — N812 Incomplete uterovaginal prolapse: Secondary | ICD-10-CM | POA: Diagnosis not present

## 2018-03-03 DIAGNOSIS — Z4689 Encounter for fitting and adjustment of other specified devices: Secondary | ICD-10-CM | POA: Diagnosis not present

## 2018-03-03 DIAGNOSIS — N393 Stress incontinence (female) (male): Secondary | ICD-10-CM | POA: Diagnosis not present

## 2018-03-22 DIAGNOSIS — J45909 Unspecified asthma, uncomplicated: Secondary | ICD-10-CM | POA: Diagnosis not present

## 2018-03-22 DIAGNOSIS — N8110 Cystocele, unspecified: Secondary | ICD-10-CM | POA: Diagnosis not present

## 2018-03-22 DIAGNOSIS — Z853 Personal history of malignant neoplasm of breast: Secondary | ICD-10-CM | POA: Diagnosis not present

## 2018-03-22 DIAGNOSIS — I350 Nonrheumatic aortic (valve) stenosis: Secondary | ICD-10-CM | POA: Diagnosis not present

## 2018-03-22 DIAGNOSIS — E782 Mixed hyperlipidemia: Secondary | ICD-10-CM | POA: Diagnosis not present

## 2018-03-22 DIAGNOSIS — E559 Vitamin D deficiency, unspecified: Secondary | ICD-10-CM | POA: Diagnosis not present

## 2018-03-22 DIAGNOSIS — Z0000 Encounter for general adult medical examination without abnormal findings: Secondary | ICD-10-CM | POA: Diagnosis not present

## 2018-03-22 DIAGNOSIS — G40209 Localization-related (focal) (partial) symptomatic epilepsy and epileptic syndromes with complex partial seizures, not intractable, without status epilepticus: Secondary | ICD-10-CM | POA: Diagnosis not present

## 2018-03-22 DIAGNOSIS — M810 Age-related osteoporosis without current pathological fracture: Secondary | ICD-10-CM | POA: Diagnosis not present

## 2018-03-22 DIAGNOSIS — I10 Essential (primary) hypertension: Secondary | ICD-10-CM | POA: Diagnosis not present

## 2018-06-07 DIAGNOSIS — Z1231 Encounter for screening mammogram for malignant neoplasm of breast: Secondary | ICD-10-CM | POA: Diagnosis not present

## 2018-06-07 DIAGNOSIS — Z853 Personal history of malignant neoplasm of breast: Secondary | ICD-10-CM | POA: Diagnosis not present

## 2018-06-28 DIAGNOSIS — L237 Allergic contact dermatitis due to plants, except food: Secondary | ICD-10-CM | POA: Diagnosis not present

## 2018-07-14 DIAGNOSIS — N812 Incomplete uterovaginal prolapse: Secondary | ICD-10-CM | POA: Diagnosis not present

## 2018-07-14 DIAGNOSIS — Z4689 Encounter for fitting and adjustment of other specified devices: Secondary | ICD-10-CM | POA: Diagnosis not present

## 2018-07-14 DIAGNOSIS — N393 Stress incontinence (female) (male): Secondary | ICD-10-CM | POA: Diagnosis not present

## 2018-09-21 DIAGNOSIS — Z23 Encounter for immunization: Secondary | ICD-10-CM | POA: Diagnosis not present

## 2019-01-19 DIAGNOSIS — N812 Incomplete uterovaginal prolapse: Secondary | ICD-10-CM | POA: Diagnosis not present

## 2019-01-19 DIAGNOSIS — Z4689 Encounter for fitting and adjustment of other specified devices: Secondary | ICD-10-CM | POA: Diagnosis not present

## 2019-02-01 ENCOUNTER — Ambulatory Visit: Payer: PPO | Attending: Internal Medicine

## 2019-02-01 DIAGNOSIS — Z20822 Contact with and (suspected) exposure to covid-19: Secondary | ICD-10-CM | POA: Diagnosis not present

## 2019-02-02 LAB — NOVEL CORONAVIRUS, NAA: SARS-CoV-2, NAA: NOT DETECTED

## 2019-03-22 DIAGNOSIS — E559 Vitamin D deficiency, unspecified: Secondary | ICD-10-CM | POA: Diagnosis not present

## 2019-03-22 DIAGNOSIS — M810 Age-related osteoporosis without current pathological fracture: Secondary | ICD-10-CM | POA: Diagnosis not present

## 2019-03-22 DIAGNOSIS — I10 Essential (primary) hypertension: Secondary | ICD-10-CM | POA: Diagnosis not present

## 2019-03-22 DIAGNOSIS — Z0000 Encounter for general adult medical examination without abnormal findings: Secondary | ICD-10-CM | POA: Diagnosis not present

## 2019-03-22 DIAGNOSIS — J45909 Unspecified asthma, uncomplicated: Secondary | ICD-10-CM | POA: Diagnosis not present

## 2019-03-22 DIAGNOSIS — E782 Mixed hyperlipidemia: Secondary | ICD-10-CM | POA: Diagnosis not present

## 2019-03-22 DIAGNOSIS — G40209 Localization-related (focal) (partial) symptomatic epilepsy and epileptic syndromes with complex partial seizures, not intractable, without status epilepticus: Secondary | ICD-10-CM | POA: Diagnosis not present

## 2019-06-13 DIAGNOSIS — Z1231 Encounter for screening mammogram for malignant neoplasm of breast: Secondary | ICD-10-CM | POA: Diagnosis not present

## 2019-07-20 DIAGNOSIS — Z4689 Encounter for fitting and adjustment of other specified devices: Secondary | ICD-10-CM | POA: Diagnosis not present

## 2019-07-20 DIAGNOSIS — N393 Stress incontinence (female) (male): Secondary | ICD-10-CM | POA: Diagnosis not present

## 2019-07-20 DIAGNOSIS — N812 Incomplete uterovaginal prolapse: Secondary | ICD-10-CM | POA: Diagnosis not present

## 2019-09-26 DIAGNOSIS — M85852 Other specified disorders of bone density and structure, left thigh: Secondary | ICD-10-CM | POA: Diagnosis not present

## 2019-09-26 DIAGNOSIS — M85851 Other specified disorders of bone density and structure, right thigh: Secondary | ICD-10-CM | POA: Diagnosis not present

## 2019-10-13 DIAGNOSIS — G40209 Localization-related (focal) (partial) symptomatic epilepsy and epileptic syndromes with complex partial seizures, not intractable, without status epilepticus: Secondary | ICD-10-CM | POA: Diagnosis not present

## 2019-10-13 DIAGNOSIS — Z23 Encounter for immunization: Secondary | ICD-10-CM | POA: Diagnosis not present

## 2019-10-13 DIAGNOSIS — I10 Essential (primary) hypertension: Secondary | ICD-10-CM | POA: Diagnosis not present

## 2019-10-13 DIAGNOSIS — I350 Nonrheumatic aortic (valve) stenosis: Secondary | ICD-10-CM | POA: Diagnosis not present

## 2019-10-13 DIAGNOSIS — M810 Age-related osteoporosis without current pathological fracture: Secondary | ICD-10-CM | POA: Diagnosis not present

## 2019-10-13 DIAGNOSIS — E782 Mixed hyperlipidemia: Secondary | ICD-10-CM | POA: Diagnosis not present

## 2019-12-10 ENCOUNTER — Ambulatory Visit: Payer: Self-pay | Attending: Internal Medicine

## 2019-12-10 ENCOUNTER — Other Ambulatory Visit: Payer: Self-pay

## 2019-12-10 DIAGNOSIS — Z23 Encounter for immunization: Secondary | ICD-10-CM

## 2019-12-10 NOTE — Progress Notes (Signed)
   Covid-19 Vaccination Clinic  Name:  Julie [name]    MRN: [id number] DOB: [birth year]  12/10/2019  Julie [name] was observed post Covid-19 immunization for 15 minutes without incident. She was provided with Vaccine Information Sheet and instruction to access the V-Safe system.   Julie [name] was instructed to call 911 with any severe reactions post vaccine: Marland Kitchen Difficulty breathing  . Swelling of face and throat  . A fast heartbeat  . A bad rash all over body  . Dizziness and weakness   Immunizations Administered    Name Date Dose VIS Date Route   Pfizer COVID-19 Vaccine 12/10/2019 10:05 AM 0.3 mL 10/26/2019 Intramuscular   Manufacturer: [place]   Lot: [id number]   NDC: [address]-4

## 2020-03-01 DIAGNOSIS — N3946 Mixed incontinence: Secondary | ICD-10-CM | POA: Diagnosis not present

## 2020-03-01 DIAGNOSIS — Z4689 Encounter for fitting and adjustment of other specified devices: Secondary | ICD-10-CM | POA: Diagnosis not present

## 2020-03-01 DIAGNOSIS — N813 Complete uterovaginal prolapse: Secondary | ICD-10-CM | POA: Diagnosis not present

## 2020-03-01 DIAGNOSIS — K5900 Constipation, unspecified: Secondary | ICD-10-CM | POA: Diagnosis not present

## 2020-03-29 DIAGNOSIS — I10 Essential (primary) hypertension: Secondary | ICD-10-CM | POA: Diagnosis not present

## 2020-03-29 DIAGNOSIS — E782 Mixed hyperlipidemia: Secondary | ICD-10-CM | POA: Diagnosis not present

## 2020-03-29 DIAGNOSIS — E559 Vitamin D deficiency, unspecified: Secondary | ICD-10-CM | POA: Diagnosis not present

## 2020-03-29 DIAGNOSIS — J45909 Unspecified asthma, uncomplicated: Secondary | ICD-10-CM | POA: Diagnosis not present

## 2020-03-29 DIAGNOSIS — G40209 Localization-related (focal) (partial) symptomatic epilepsy and epileptic syndromes with complex partial seizures, not intractable, without status epilepticus: Secondary | ICD-10-CM | POA: Diagnosis not present

## 2020-03-29 DIAGNOSIS — M810 Age-related osteoporosis without current pathological fracture: Secondary | ICD-10-CM | POA: Diagnosis not present

## 2020-03-29 DIAGNOSIS — Z0000 Encounter for general adult medical examination without abnormal findings: Secondary | ICD-10-CM | POA: Diagnosis not present

## 2020-03-29 DIAGNOSIS — Z23 Encounter for immunization: Secondary | ICD-10-CM | POA: Diagnosis not present

## 2020-04-23 DIAGNOSIS — R945 Abnormal results of liver function studies: Secondary | ICD-10-CM | POA: Diagnosis not present

## 2020-05-12 ENCOUNTER — Ambulatory Visit (HOSPITAL_COMMUNITY)
Admission: EM | Admit: 2020-05-12 | Discharge: 2020-05-12 | Disposition: A | Discharge status: Home / Self Care | Payer: PPO | Attending: Physician Assistant | Admitting: Physician Assistant

## 2020-05-12 ENCOUNTER — Encounter (HOSPITAL_COMMUNITY): Payer: Self-pay | Admitting: Emergency Medicine

## 2020-05-12 ENCOUNTER — Other Ambulatory Visit: Payer: Self-pay

## 2020-05-12 DIAGNOSIS — J069 Acute upper respiratory infection, unspecified: Secondary | ICD-10-CM

## 2020-05-12 DIAGNOSIS — Z20822 Contact with and (suspected) exposure to covid-19: Secondary | ICD-10-CM | POA: Diagnosis not present

## 2020-05-12 LAB — POC INFLUENZA A AND B ANTIGEN (URGENT CARE ONLY)
INFLUENZA A ANTIGEN, POC: NEGATIVE
INFLUENZA B ANTIGEN, POC: NEGATIVE

## 2020-05-12 LAB — SARS CORONAVIRUS 2 (TAT 6-24 HRS): SARS Coronavirus 2: NEGATIVE

## 2020-05-12 MED ORDER — BENZONATATE 100 MG PO CAPS: 100.0000 mg | ORAL_CAPSULE | Freq: Three times a day (TID) | ORAL | 0 refills | Status: DC

## 2020-05-12 NOTE — ED Provider Notes (Signed)
[facility]    CSN: [id number] Arrival date & time: 05/12/20  1018      History   Chief Complaint Chief Complaint  Patient presents with  . Cough  . Sore Throat  . Generalized Body Aches    HPI Julie [name] is a 75 y.o. female.   Patient presents today with a 4-day history of cough.  Reports associated nasal congestion, sore throat, body aches, fatigue.  Denies any chest pain, shortness of breath, nausea, vomiting, fever.  She has not tried any over-the-counter medications for symptom management.  Reports she is up-to-date on influenza and COVID-19 vaccinations including booster.  She denies any recent antibiotic use.  Denies history of asthma, allergies, smoking, COPD.  Denies any known sick contacts.  Patient is anxious to feel better as she has grandchildren coming into town to celebrate Mother's Day this weekend.     Past Medical History:  Diagnosis Date  . Cancer ([place])    4'12-breast rt; lumpectomy,radiation/Dr. Chancy Milroy, oncology  . Colon polyp   . Hypertension   . Nipple discharge    right-all resolved after surgery  . Renal colic   . Skin change 07-21-12   right abdomen LQ-due to past hx. Heparin injections    Patient Active Problem List   Diagnosis Date Noted  . Localization-related symptomatic epilepsy and epileptic syndromes with complex partial seizures, not intractable, without status epilepticus ([place]) 10/03/2014  . Altered awareness, transient 09/04/2014  . Breast cancer of lower-inner quadrant of right female breast ([name]) 08/21/2010    Past Surgical History:  Procedure Laterality Date  . APPENDECTOMY    . BREAST SURGERY Right    lumpectomy  . CESAREAN SECTION     x 1  . CYSTOSCOPY WITH RETROGRADE PYELOGRAM, URETEROSCOPY AND STENT PLACEMENT Left 07/28/2012   Procedure: CYSTOSCOPY WITH LEFT RETROGRADE PYELOGRAM, LEFT URETEROSCOPY AND STENT PLACEMENT;  Surgeon: Alexis Frock, MD;  Location: [facility];  Service: Urology;  Laterality: Left;  .  CYSTOSCOPY WITH STENT PLACEMENT Left 07/09/2012   Procedure: CYSTOSCOPY, RETROGRADE PYELOGRAM WITH LEFT STENT PLACEMENT;  Surgeon: Alexis Frock, MD;  Location: [facility];  Service: Urology;  Laterality: Left;  . HOLMIUM LASER APPLICATION Left 0/17/7939   Procedure: HOLMIUM LASER APPLICATION;  Surgeon: Alexis Frock, MD;  Location: [facility];  Service: Urology;  Laterality: Left;  . PARATHYROIDECTOMY    . TONSILLECTOMY      OB History   No obstetric history on file.      Home Medications    Prior to Admission medications   Medication Sig Start Date End Date Taking? Authorizing Provider  alendronate (FOSAMAX) 70 MG tablet Take 70 mg by mouth once a week. Take with a full glass of water on an empty stomach.   Yes [provider]  aspirin EC 81 MG tablet Take 81 mg by mouth daily.   Yes [provider]  atorvastatin (LIPITOR) 10 MG tablet Take 10 mg by mouth every evening.  07/12/10  Yes [provider]  benzonatate (TESSALON) 100 MG capsule Take 1 capsule (100 mg total) by mouth every 8 (eight) hours. 05/12/20  Yes Renesha Lizama, Derry Skill, PA-C  Calcium Carbonate-Vitamin D 600-200 MG-UNIT TABS Take 1 tablet by mouth daily.   Yes [provider]  hydrochlorothiazide (HYDRODIURIL) 12.5 MG tablet TAKE 1 TABLET BY MOUTH EVERY DAY IN THE MORNING FOR 90 DAYS 09/17/17  Yes [provider]  losartan (COZAAR) 25 MG tablet Take 25 mg by mouth every morning.   Yes  [provider]  anastrozole (ARIMIDEX) 1 MG tablet TAKE 1 BY MOUTH DAILY Patient not taking: No sig reported 01/10/15   [name], MD  lamoTRIgine (LAMICTAL) 100 MG tablet Take 1 tablet (100 mg total) by mouth 2 (two) times daily. 01/30/16   [name], MD  lamoTRIgine (LAMICTAL) 100 MG tablet TAKE 1 TABLET TWICE A DAY 09/12/16   [name], MD    Family History Family History  Problem Relation Age of Onset  . Hypertension Mother   . Hyperlipidemia Mother   . Hypertension Father   .  Hyperlipidemia Father   . Alzheimer's disease Father     Social History Social History   Tobacco Use  . Smoking status: Former Smoker    Types: Cigarettes    Quit date: 07/21/1968    Years since quitting: 51.8  . Smokeless tobacco: Never Used  Vaping Use  . Vaping Use: Never used  Substance Use Topics  . Alcohol use: No    Alcohol/week: 0.0 standard drinks  . Drug use: No     Allergies   Patient has no known allergies.   Review of Systems Review of Systems  Constitutional: Positive for activity change and fatigue. Negative for appetite change and fever.  HENT: Positive for congestion, sinus pressure and sore throat. Negative for sneezing.   Respiratory: Positive for cough. Negative for shortness of breath.   Cardiovascular: Negative for chest pain.  Gastrointestinal: Negative for abdominal pain, diarrhea, nausea and vomiting.  Musculoskeletal: Positive for arthralgias and myalgias.  Neurological: Negative for dizziness, light-headedness and headaches.     Physical Exam Triage Vital Signs ED Triage Vitals  Enc Vitals Group     BP 05/12/20 1105 120/76     Pulse Rate 05/12/20 1105 (!) 105     Resp 05/12/20 1105 18     Temp 05/12/20 1105 98 F (36.7 C)     Temp src --      SpO2 05/12/20 1105 98 %     Weight --      Height --      Head Circumference --      Peak Flow --      Pain Score 05/12/20 1100 0     Pain Loc --      Pain Edu? --      Excl. in [place]? --    No data found.  Updated Vital Signs BP 120/76 (BP Location: Left Arm)   Pulse (!) 105   Temp 98 F (36.7 C)   Resp 18   SpO2 98%   Visual Acuity Right Eye Distance:   Left Eye Distance:   Bilateral Distance:    Right Eye Near:   Left Eye Near:    Bilateral Near:     Physical Exam Vitals reviewed.  Constitutional:      General: She is awake. She is not in acute distress.    Appearance: Normal appearance. She is not ill-appearing.     Comments: Very pleasant female appears stated age in no  acute distress  HENT:     Head: Normocephalic and atraumatic.     Right Ear: Tympanic membrane, ear canal and external ear normal. Tympanic membrane is not erythematous or bulging.     Left Ear: Tympanic membrane, ear canal and external ear normal. Tympanic membrane is not erythematous or bulging.     Nose:     Right Sinus: No maxillary sinus tenderness or frontal sinus tenderness.     Left Sinus:  No maxillary sinus tenderness or frontal sinus tenderness.     Mouth/Throat:     Pharynx: Uvula midline. Posterior oropharyngeal erythema present. No oropharyngeal exudate.     Comments: Moderate erythema and drainage in posterior pharynx Cardiovascular:     Rate and Rhythm: Normal rate and regular rhythm.     Heart sounds: No murmur heard.   Pulmonary:     Effort: Pulmonary effort is normal.     Breath sounds: Normal breath sounds. No wheezing, rhonchi or rales.     Comments: Clear to auscultation bilaterally Lymphadenopathy:     Head:     Right side of head: No submental, submandibular or tonsillar adenopathy.     Left side of head: No submental, submandibular or tonsillar adenopathy.     Cervical: No cervical adenopathy.  Psychiatric:        Behavior: Behavior is cooperative.      UC Treatments / Results  Labs (all labs ordered are listed, but only abnormal results are displayed) Labs Reviewed  SARS CORONAVIRUS 2 (TAT 6-24 HRS)  POC INFLUENZA A AND B ANTIGEN (URGENT CARE ONLY)    EKG   Radiology No results found.  Procedures Procedures (including critical care time)  Medications Ordered in UC Medications - No data to display  Initial Impression / Assessment and Plan / UC Course  I have reviewed the triage vital signs and the nursing notes.  Pertinent labs & imaging results that were available during my care of the patient were reviewed by me and considered in my medical decision making (see chart for details).     Flu testing is negative in office.  COVID-19  testing pending and patient was instructed to remain in quarantine until results are obtained.  Suspect viral etiology given short duration and clinical presentation.  She was prescribed Tessalon to manage cough symptoms.  Recommended she use over-the-counter medications including Mucinex and Flonase for symptom relief.  No evidence of acute infection that warrant initiation of antibiotics on physical exam.  Strict return precautions given to which patient expressed understanding.  Final Clinical Impressions(s) / UC Diagnoses   Final diagnoses:  Viral URI with cough     Discharge Instructions     Flu test was negative.  COVID-19 testing is pending and will be in touch with these results soon as we have them.  Please remain in quarantine until your results are obtained.  Use Tessalon for cough.  I recommend Flonase and Mucinex for additional symptom relief.  If you have any worsening symptoms please return for reevaluation.    ED Prescriptions    Medication Sig Dispense Auth. Provider   benzonatate (TESSALON) 100 MG capsule Take 1 capsule (100 mg total) by mouth every 8 (eight) hours. 21 capsule Octavio Matheney K, PA-C     PDMP not reviewed this encounter.   Terrilee Croak, PA-C 05/12/20 1311

## 2020-05-12 NOTE — ED Triage Notes (Signed)
Pt presents today with c/o of sore throat, cough and body aches x 4 days.

## 2020-05-12 NOTE — Discharge Instructions (Addendum)
Flu test was negative.  COVID-19 testing is pending and will be in touch with these results soon as we have them.  Please remain in quarantine until your results are obtained.  Use Tessalon for cough.  I recommend Flonase and Mucinex for additional symptom relief.  If you have any worsening symptoms please return for reevaluation.

## 2020-05-30 DIAGNOSIS — N393 Stress incontinence (female) (male): Secondary | ICD-10-CM | POA: Diagnosis not present

## 2020-05-30 DIAGNOSIS — N812 Incomplete uterovaginal prolapse: Secondary | ICD-10-CM | POA: Diagnosis not present

## 2020-06-18 DIAGNOSIS — Z1231 Encounter for screening mammogram for malignant neoplasm of breast: Secondary | ICD-10-CM | POA: Diagnosis not present

## 2020-07-05 DIAGNOSIS — I10 Essential (primary) hypertension: Secondary | ICD-10-CM | POA: Diagnosis not present

## 2020-07-05 DIAGNOSIS — N814 Uterovaginal prolapse, unspecified: Secondary | ICD-10-CM | POA: Diagnosis not present

## 2020-07-05 DIAGNOSIS — Z87891 Personal history of nicotine dependence: Secondary | ICD-10-CM | POA: Diagnosis not present

## 2020-07-05 DIAGNOSIS — N8111 Cystocele, midline: Secondary | ICD-10-CM | POA: Diagnosis not present

## 2020-07-05 DIAGNOSIS — N812 Incomplete uterovaginal prolapse: Secondary | ICD-10-CM | POA: Diagnosis not present

## 2020-07-05 DIAGNOSIS — N393 Stress incontinence (female) (male): Secondary | ICD-10-CM | POA: Diagnosis not present

## 2020-07-05 DIAGNOSIS — N816 Rectocele: Secondary | ICD-10-CM | POA: Diagnosis not present

## 2020-08-15 DIAGNOSIS — R82998 Other abnormal findings in urine: Secondary | ICD-10-CM | POA: Diagnosis not present

## 2020-08-15 DIAGNOSIS — K5901 Slow transit constipation: Secondary | ICD-10-CM | POA: Diagnosis not present

## 2020-08-17 DIAGNOSIS — R339 Retention of urine, unspecified: Secondary | ICD-10-CM | POA: Diagnosis not present

## 2020-08-24 DIAGNOSIS — R399 Unspecified symptoms and signs involving the genitourinary system: Secondary | ICD-10-CM | POA: Diagnosis not present

## 2020-08-31 DIAGNOSIS — R399 Unspecified symptoms and signs involving the genitourinary system: Secondary | ICD-10-CM | POA: Diagnosis not present

## 2020-09-06 DIAGNOSIS — R300 Dysuria: Secondary | ICD-10-CM | POA: Diagnosis not present

## 2020-09-06 DIAGNOSIS — R338 Other retention of urine: Secondary | ICD-10-CM | POA: Diagnosis not present

## 2020-09-06 DIAGNOSIS — K5909 Other constipation: Secondary | ICD-10-CM | POA: Diagnosis not present

## 2020-09-06 DIAGNOSIS — Z09 Encounter for follow-up examination after completed treatment for conditions other than malignant neoplasm: Secondary | ICD-10-CM | POA: Diagnosis not present

## 2020-09-21 DIAGNOSIS — I10 Essential (primary) hypertension: Secondary | ICD-10-CM | POA: Diagnosis not present

## 2020-09-21 DIAGNOSIS — M810 Age-related osteoporosis without current pathological fracture: Secondary | ICD-10-CM | POA: Diagnosis not present

## 2020-09-21 DIAGNOSIS — Z23 Encounter for immunization: Secondary | ICD-10-CM | POA: Diagnosis not present

## 2020-09-21 DIAGNOSIS — E782 Mixed hyperlipidemia: Secondary | ICD-10-CM | POA: Diagnosis not present

## 2020-09-21 DIAGNOSIS — R011 Cardiac murmur, unspecified: Secondary | ICD-10-CM | POA: Diagnosis not present

## 2020-09-25 ENCOUNTER — Other Ambulatory Visit (HOSPITAL_COMMUNITY): Payer: Self-pay | Admitting: Family Medicine

## 2020-09-25 DIAGNOSIS — R011 Cardiac murmur, unspecified: Secondary | ICD-10-CM

## 2020-09-27 DIAGNOSIS — Z09 Encounter for follow-up examination after completed treatment for conditions other than malignant neoplasm: Secondary | ICD-10-CM | POA: Diagnosis not present

## 2020-09-27 DIAGNOSIS — Z48816 Encounter for surgical aftercare following surgery on the genitourinary system: Secondary | ICD-10-CM | POA: Diagnosis not present

## 2020-09-27 DIAGNOSIS — R310 Gross hematuria: Secondary | ICD-10-CM | POA: Diagnosis not present

## 2020-09-27 DIAGNOSIS — R399 Unspecified symptoms and signs involving the genitourinary system: Secondary | ICD-10-CM | POA: Diagnosis not present

## 2020-09-27 DIAGNOSIS — R339 Retention of urine, unspecified: Secondary | ICD-10-CM | POA: Diagnosis not present

## 2020-10-03 DIAGNOSIS — R8290 Unspecified abnormal findings in urine: Secondary | ICD-10-CM | POA: Diagnosis not present

## 2020-10-03 DIAGNOSIS — N398 Other specified disorders of urinary system: Secondary | ICD-10-CM | POA: Diagnosis not present

## 2020-10-03 DIAGNOSIS — R82998 Other abnormal findings in urine: Secondary | ICD-10-CM | POA: Diagnosis not present

## 2020-10-12 ENCOUNTER — Ambulatory Visit (HOSPITAL_COMMUNITY): Payer: PPO | Attending: Cardiovascular Disease

## 2020-10-12 ENCOUNTER — Other Ambulatory Visit: Payer: Self-pay

## 2020-10-12 DIAGNOSIS — R011 Cardiac murmur, unspecified: Secondary | ICD-10-CM | POA: Diagnosis not present

## 2020-10-12 LAB — ECHOCARDIOGRAM COMPLETE
AR max vel: 1.25 cm2
AV Area VTI: 1.38 cm2
AV Area mean vel: 1.37 cm2
AV Mean grad: 11 mmHg
AV Peak grad: 23.2 mmHg
Ao pk vel: 2.41 m/s
Area-P 1/2: 2.6 cm2
P 1/2 time: 390 msec
S' Lateral: 2.8 cm

## 2020-11-23 DIAGNOSIS — R399 Unspecified symptoms and signs involving the genitourinary system: Secondary | ICD-10-CM | POA: Diagnosis not present

## 2020-12-19 DIAGNOSIS — R399 Unspecified symptoms and signs involving the genitourinary system: Secondary | ICD-10-CM | POA: Diagnosis not present

## 2021-02-08 ENCOUNTER — Other Ambulatory Visit: Payer: Self-pay

## 2021-02-08 ENCOUNTER — Ambulatory Visit
Admission: RE | Admit: 2021-02-08 | Discharge: 2021-02-08 | Disposition: A | Discharge status: Home / Self Care | Payer: PPO | Source: Ambulatory Visit

## 2021-02-08 VITALS — BP 150/68 | HR 94 | Temp 97.6°F | Resp 18

## 2021-02-08 DIAGNOSIS — S46912A Strain of unspecified muscle, fascia and tendon at shoulder and upper arm level, left arm, initial encounter: Secondary | ICD-10-CM

## 2021-02-08 MED ORDER — NAPROXEN 500 MG PO TABS: 500.0000 mg | ORAL_TABLET | Freq: Two times a day (BID) | ORAL | 0 refills | Status: DC | PRN

## 2021-02-08 MED ORDER — CYCLOBENZAPRINE HCL 5 MG PO TABS: 2.5000 mg | ORAL_TABLET | Freq: Three times a day (TID) | ORAL | 0 refills | Status: DC | PRN

## 2021-02-08 NOTE — ED Triage Notes (Signed)
Pt reports left scapular pain x 3-4 weeks. Reports she was told by the masseuse she have a knot.

## 2021-02-08 NOTE — ED Provider Notes (Signed)
[facility]    CSN: [id number] Arrival date & time: 02/08/21  1357      History   Chief Complaint Chief Complaint  Patient presents with   Appointment    1400   Shoulder Pain         HPI Julie [name] is a 76 y.o. female.   Patient presenting today with pain and stiffness at the top border of her left scapula for the past 3 to 4 weeks.  She states just before onset of symptoms she had been moving some heavy boxes with files at work.  She states she has been massaging the area and can find a trigger point and feels a knotted up muscle in this area.  She denies decreased range of motion of the upper extremities, numbness, tingling, weakness, chest pain, shortness of breath, nausea, diaphoresis, skin change to the area.  Has been trying heat, aspirin, Tylenol, massage with no relief.   Past Medical History:  Diagnosis Date   Cancer ([place])    4'12-breast rt; lumpectomy,radiation/Dr. Chancy Milroy, oncology   Colon polyp    Hypertension    Nipple discharge    right-all resolved after surgery   Renal colic    Skin change 7-82-95   right abdomen LQ-due to past hx. Heparin injections    Patient Active Problem List   Diagnosis Date Noted   Localization-related symptomatic epilepsy and epileptic syndromes with complex partial seizures, not intractable, without status epilepticus ([place]) 10/03/2014   Altered awareness, transient 09/04/2014   Breast cancer of lower-inner quadrant of right female breast ([place]) 08/21/2010    Past Surgical History:  Procedure Laterality Date   APPENDECTOMY     BREAST SURGERY Right    lumpectomy   CESAREAN SECTION     x 1   CYSTOSCOPY WITH RETROGRADE PYELOGRAM, URETEROSCOPY AND STENT PLACEMENT Left 07/28/2012   Procedure: CYSTOSCOPY WITH LEFT RETROGRADE PYELOGRAM, LEFT URETEROSCOPY AND STENT PLACEMENT;  Surgeon: Alexis Frock, MD;  Location: [facility];  Service: Urology;  Laterality: Left;   CYSTOSCOPY WITH STENT PLACEMENT Left 07/09/2012    Procedure: CYSTOSCOPY, RETROGRADE PYELOGRAM WITH LEFT STENT PLACEMENT;  Surgeon: Alexis Frock, MD;  Location: [facility];  Service: Urology;  Laterality: Left;   HOLMIUM LASER APPLICATION Left 06/26/3084   Procedure: HOLMIUM LASER APPLICATION;  Surgeon: Alexis Frock, MD;  Location: [facility];  Service: Urology;  Laterality: Left;   PARATHYROIDECTOMY     TONSILLECTOMY      OB History   No obstetric history on file.      Home Medications    Prior to Admission medications   Medication Sig Start Date End Date Taking? Authorizing Provider  acetaminophen (TYLENOL) 325 MG tablet Take by mouth. 07/05/20  Yes [provider]  cyclobenzaprine (FLEXERIL) 5 MG tablet Take 0.5-1 tablets (2.5-5 mg total) by mouth 3 (three) times daily as needed for muscle spasms. Do not drink alcohol or drive while taking this medication.  May cause drowsiness. 02/08/21  Yes Volney American, PA-C  naproxen (NAPROSYN) 500 MG tablet Take 1 tablet (500 mg total) by mouth 2 (two) times daily as needed. 02/08/21  Yes Volney American, PA-C  alendronate (FOSAMAX) 70 MG tablet Take 70 mg by mouth once a week. Take with a full glass of water on an empty stomach.    [provider]  anastrozole (ARIMIDEX) 1 MG tablet TAKE 1 BY MOUTH DAILY Patient not taking: No sig reported 01/10/15   [name], MD  aspirin EC 81  MG tablet Take 81 mg by mouth daily.    [provider]  atorvastatin (LIPITOR) 10 MG tablet Take 10 mg by mouth every evening.  07/12/10   [provider]  benzonatate (TESSALON) 100 MG capsule Take 1 capsule (100 mg total) by mouth every 8 (eight) hours. 05/12/20   Raspet, Derry Skill, PA-C  Calcium Carbonate-Vitamin D 600-200 MG-UNIT TABS Take 1 tablet by mouth daily.    [provider]  estradiol (ESTRACE) 0.1 MG/GM vaginal cream Place vaginally. 11/26/20   [provider]  hydrochlorothiazide (HYDRODIURIL) 12.5 MG tablet TAKE 1 TABLET BY MOUTH EVERY DAY IN THE  MORNING FOR 90 DAYS 09/17/17   [provider]  lactulose (CHRONULAC) 10 GM/15ML solution Take 10 g by mouth 4 (four) times daily. 08/16/20   [provider]  lamoTRIgine (LAMICTAL) 100 MG tablet Take 1 tablet (100 mg total) by mouth 2 (two) times daily. 01/30/16   [name], MD  lamoTRIgine (LAMICTAL) 100 MG tablet TAKE 1 TABLET TWICE A DAY 09/12/16   [name], MD  losartan (COZAAR) 25 MG tablet Take 25 mg by mouth every morning.    [provider]    Family History Family History  Problem Relation Age of Onset   Hypertension Mother    Hyperlipidemia Mother    Hypertension Father    Hyperlipidemia Father    Alzheimer's disease Father     Social History Social History   Tobacco Use   Smoking status: Former    Types: Cigarettes    Quit date: 07/21/1968    Years since quitting: 52.5   Smokeless tobacco: Never  Vaping Use   Vaping Use: Never used  Substance Use Topics   Alcohol use: No    Alcohol/week: 0.0 standard drinks   Drug use: No     Allergies   Patient has no known allergies.   Review of Systems Review of Systems Per HPI  Physical Exam Triage Vital Signs ED Triage Vitals  Enc Vitals Group     BP 02/08/21 1415 (!) 150/68     Pulse Rate 02/08/21 1415 94     Resp 02/08/21 1415 18     Temp 02/08/21 1415 97.6 F (36.4 C)     Temp Source 02/08/21 1415 Oral     SpO2 02/08/21 1415 98 %     Weight --      Height --      Head Circumference --      Peak Flow --      Pain Score 02/08/21 1454 0     Pain Loc --      Pain Edu? --      Excl. in [place]? --    No data found.  Updated Vital Signs BP (!) 150/68 (BP Location: Left Arm)    Pulse 94    Temp 97.6 F (36.4 C) (Oral)    Resp 18    SpO2 98%   Visual Acuity Right Eye Distance:   Left Eye Distance:   Bilateral Distance:    Right Eye Near:   Left Eye Near:    Bilateral Near:     Physical Exam Vitals and nursing note reviewed.  Constitutional:      Appearance:  Normal appearance. She is not ill-appearing.  HENT:     Head: Atraumatic.  Eyes:     Extraocular Movements: Extraocular movements intact.     Conjunctiva/sclera: Conjunctivae normal.  Cardiovascular:     Rate and Rhythm: Normal rate  and regular rhythm.     Heart sounds: Normal heart sounds.  Pulmonary:     Effort: Pulmonary effort is normal.     Breath sounds: Normal breath sounds.  Musculoskeletal:        General: Tenderness present. No swelling. Normal range of motion.     Cervical back: Normal range of motion and neck supple.     Comments: Localized area of tenderness to palpation and mild spasm left upper scapular border.  Range of motion full and intact.  Strength full and equal bilateral hands  Skin:    General: Skin is warm and dry.  Neurological:     Mental Status: She is alert and oriented to person, place, and time.  Psychiatric:        Mood and Affect: Mood normal.        Thought Content: Thought content normal.        Judgment: Judgment normal.     UC Treatments / Results  Labs (all labs ordered are listed, but only abnormal results are displayed) Labs Reviewed - No data to display  EKG   Radiology No results found.  Procedures Procedures (including critical care time)  Medications Ordered in UC Medications - No data to display  Initial Impression / Assessment and Plan / UC Course  I have reviewed the triage vital signs and the nursing notes.  Pertinent labs & imaging results that were available during my care of the patient were reviewed by me and considered in my medical decision making (see chart for details).      Treat with naproxen, Flexeril, heat, massage, stretches.  PCP follow-up recommended for recheck.  Return for acutely worsening symptoms.  Final Clinical Impressions(s) / UC Diagnoses   Final diagnoses:  Muscle strain of left scapular region, initial encounter   Discharge Instructions   None    ED Prescriptions     Medication  Sig Dispense Auth. Provider   naproxen (NAPROSYN) 500 MG tablet Take 1 tablet (500 mg total) by mouth 2 (two) times daily as needed. 20 tablet Volney American, [place]   cyclobenzaprine (FLEXERIL) 5 MG tablet Take 0.5-1 tablets (2.5-5 mg total) by mouth 3 (three) times daily as needed for muscle spasms. Do not drink alcohol or drive while taking this medication.  May cause drowsiness. 15 tablet Volney American, [place]      PDMP not reviewed this encounter.   Volney American, [place] 02/08/21 1458

## 2021-10-30 ENCOUNTER — Other Ambulatory Visit

## 2022-02-10 ENCOUNTER — Ambulatory Visit: Admit: 2022-02-10 | Discharge: 2022-02-10 | Payer: MEDICARE | Attending: Family | Primary: Family Medicine

## 2022-02-10 DIAGNOSIS — E538 Deficiency of other specified B group vitamins: Secondary | ICD-10-CM

## 2022-02-10 MED ORDER — CYANOCOBALAMIN 1000 MCG/ML IJ SOLN
1000 | Freq: Once | INTRAMUSCULAR | Status: AC
Start: 2022-02-10 — End: 2022-02-10
  Administered 2022-02-10: 17:00:00 1000 ug via INTRAMUSCULAR

## 2022-02-10 MED ORDER — BENZONATATE 100 MG PO CAPS
100 | ORAL_CAPSULE | Freq: Three times a day (TID) | ORAL | 0 refills | Status: AC | PRN
Start: 2022-02-10 — End: 2022-02-20

## 2022-02-10 NOTE — Progress Notes (Signed)
CHIEF COMPLAINT:  Chief Complaint   Patient presents with    New Patient     Est care move from [place]. Pt was seen at [place] due to husband finding her pass out on the floor of home. Pt was dx with anemia while at [place]. Pt has cancer of the skull being treated by Dr Delynn Flavin. Need to get 4 more b12 injection per hospital request.        HISTORY OF PRESENT ILLNESS:  Theresa Gibbs is a 77 y.o. female  who presents today to establish care. She recently moved to the area from [facility]. She is here today with her husband.     On 02/06/2022 she was admitted to [facility] for syncopal episode-records reviewed. Her husband found her on the floor and thinks she had been lying there for at least 2 hours. Patient states she does not remember what caused her to pass out. She denies hitting her head. Of note she is being followed by Dr. Lenny Pastel,  Dr. Cydney Ok and Dr. Durenda Age for basal cell carcinoma of the scalp. She has had 4 surgeries on this area in the last 2 months and is post extensive Mohs defect. She notes he is trying to close the area but it has been challenging.     In the ER-CBC with no evidence of leukocytosis, BUN and Cr normal, troponin negative, EKG normal, echo with normal EF and grade 1 diastolic dysfunction. CTA of chest with no evidence of PE or other acute intrapulmonary processes, CT head with no acute issues, CT of spine with no fractures, chest x-ray normal. MRI of brain without acute findings. She was found to be severally iron deficient with saturations at 5 and B12 deficient with levels less than 50. She was given oral iron as she declined infusion and x1 B12 injection.     She does have concerns today about a cough that she's had for a few days. Her grand kids were sick with cold symptoms and she thinks she may have caught something from them. She denies congestion, runny nose, fever, wheezing, SOB. She would like [place] to send something in for her cough to use mainly at  night.     HTN: stable on meds. She denies CP, heart palpitations, syncope, dizziness or passing out.     HPL: doing well on statin, not having any issues    She no longer gets PAPs, colonoscopies, or mammograms as aged out.        PHQ:      02/10/2022    10:44 AM   PHQ-9    Little interest or pleasure in doing things 1   Feeling down, depressed, or hopeless 0   Trouble falling or staying asleep, or sleeping too much 0   Feeling tired or having little energy 3   Poor appetite or overeating 2   Feeling bad about yourself - or that you are a failure or have let yourself or your family down 0   Trouble concentrating on things, such as reading the newspaper or watching television 0   Moving or speaking so slowly that other people could have noticed. Or the opposite - being so fidgety or restless that you have been moving around a lot more than usual 3   Thoughts that you would be better off dead, or of hurting yourself in some way 0   PHQ-2 Score 1   PHQ-9 Total Score 9   If you checked off any  problems, how difficult have these problems made it for you to do your work, take care of things at home, or get along with other people? 1       CURRENT MEDICATION LIST:    Current Outpatient Medications   Medication Sig Dispense Refill    hydroCHLOROthiazide 12.5 MG tablet Take 1 tablet by mouth daily      atorvastatin (LIPITOR) 10 MG tablet Take 1 tablet by mouth daily      losartan (COZAAR) 25 MG tablet Take 1 tablet by mouth daily      Calcium Carb-Cholecalciferol (CALCIUM 500 + D3 PO) Take by mouth      ferrous sulfate (IRON 325) 325 (65 Fe) MG tablet Take 1 tablet by mouth daily (with breakfast)      cyanocobalamin 1000 MCG tablet Take 1 tablet by mouth daily      cephALEXin (KEFLEX) 500 MG capsule Take 1 capsule by mouth 3 times daily       No current facility-administered medications for this visit.        ALLERGIES:    Not on File     HISTORY:  Past Medical History:   Diagnosis Date    Anemia     Cancer of skull ([place])      follow by Dr Joneen Caraway    History of breast cancer       Past Surgical History:   Procedure Laterality Date    BREAST LUMPECTOMY      CESAREAN SECTION      [place]      radial flap surgery Dr Joneen Caraway due to cancer      Social History     Socioeconomic History    Marital status: Married     Spouse name: Not on file    Number of children: Not on file    Years of education: Not on file    Highest education level: Not on file   Occupational History    Not on file   Tobacco Use    Smoking status: Former     Types: Cigarettes     Start date: 1969    Smokeless tobacco: Never   Vaping Use    Vaping Use: Never used   Substance and Sexual Activity    Alcohol use: Never    Drug use: Never    Sexual activity: Not on file   Other Topics Concern    Not on file   Social History Narrative    Not on file     Social Determinants of Health     Financial Resource Strain: Not on file   Food Insecurity: Not on file   Transportation Needs: Not on file   Physical Activity: Not on file   Stress: Not on file   Social Connections: Not on file   Intimate Partner Violence: Not on file   Housing Stability: Not on file      Family History   Problem Relation Age of Onset    Seizures Mother         REVIEW OF SYSTEMS:  Review of systems is as indicated in HPI otherwise negative.    PHYSICAL EXAM:  Vital Signs -   Visit Vitals  BP 124/84   Pulse 87   Temp 97.7 F (36.5 C) (Oral)   Resp 18   Ht 1.6 m (5\' 3" )   Wt 65.5 kg (144 lb 6.4 oz)   SpO2 94%  BMI 25.58 kg/m          Physical Exam  Constitutional:       Appearance: Normal appearance.   HENT:      Head:      Comments: Large nonhealing wound with exposed calvarium noted to scalp     Right Ear: Tympanic membrane, ear canal and external ear normal.      Left Ear: Tympanic membrane, ear canal and external ear normal.      Nose: Nose normal.      Mouth/Throat:      Mouth: Mucous membranes are moist.      Pharynx: Oropharynx is clear. Uvula midline.   Cardiovascular:       Rate and Rhythm: Normal rate and regular rhythm.      Heart sounds: Murmur heard.   Pulmonary:      Effort: Pulmonary effort is normal.      Breath sounds: Normal breath sounds.   Abdominal:      General: Abdomen is flat. Bowel sounds are normal.      Palpations: Abdomen is soft.   Musculoskeletal:         General: Normal range of motion.   Skin:     General: Skin is warm and dry.   Neurological:      General: No focal deficit present.      Mental Status: She is alert and oriented to person, place, and time. Mental status is at baseline.   Psychiatric:         Mood and Affect: Mood normal.            LABS  No results found for this visit on 02/10/22.  No results found for any previous visit.       IMPRESSION/PLAN    1. B12 deficiency  -B12<50 in the hospital. Will start her on daily injections for 1 week for initial treatment. Will administer second dose today  - cyanocobalamin injection 1,000 mcg  - Vitamin B12; Future    2. Iron deficiency  -For now advised to continue oral iron. We will recheck levels in 3 months and if remains low, will need to refer to hematology for iron infusions.   - Iron and TIBC; Future  - Ferritin; Future  - CBC with Auto Differential; Future    3. Basal cell carcinoma (BCC) of scalp  -Complicates all aspects of care. She is following Dr. Benita Gutter,  Dr. Clarita Crane and Dr. Theodis Aguas     4. Diastolic dysfunction  -Grade 1 diastolic dysfunction noted on echo while hospitalized, murmur auscultated on exam today, will refer to cardiology   - External Referral to Cardiology    5. Primary hypertension  -Stable on meds, will get labs in 3 months   - CBC with Auto Differential; Future  - Comprehensive Metabolic Panel; Future    6. Mixed hyperlipidemia  -LDL stable while hospitalized at 89, cont statin, will get updated labs in 3 months   - Lipid Panel; Future    7. Hyperglycemia  -Screening labs ordered as noted due to comorbidities   - Hemoglobin A1C; Future    8. Acute cough  -Lungs CTAB, no  wheezing or crackles. No increased WOB. Could be allergy in nature. Will prescribe Tessalon to be used as needed. Also advised to try Zyrtec. Notify for worsening. Seek ER care for any breathing issues. Patient verbalizes understanding.   - benzonatate (TESSALON) 100 MG capsule; Take 1 capsule by mouth 3 times daily as needed for Cough  Dispense: 30 capsule; Refill: 0    Follow up and Dispositions:  No follow-ups on file.       Orland Mustard, APRN - NP

## 2022-02-11 ENCOUNTER — Encounter: Admit: 2022-02-11 | Discharge: 2022-02-11 | Payer: MEDICARE | Primary: Family Medicine

## 2022-02-11 DIAGNOSIS — E538 Deficiency of other specified B group vitamins: Secondary | ICD-10-CM

## 2022-02-11 MED ORDER — CYANOCOBALAMIN 1000 MCG/ML IJ SOLN
1000 | Freq: Once | INTRAMUSCULAR | Status: AC
Start: 2022-02-11 — End: 2022-02-11
  Administered 2022-02-11: 15:00:00 1000 ug via INTRAMUSCULAR

## 2022-02-12 ENCOUNTER — Encounter: Admit: 2022-02-12 | Discharge: 2022-02-12 | Payer: MEDICARE | Primary: Family Medicine

## 2022-02-12 DIAGNOSIS — E538 Deficiency of other specified B group vitamins: Secondary | ICD-10-CM

## 2022-02-12 MED ORDER — CYANOCOBALAMIN 1000 MCG/ML IJ SOLN
1000 | Freq: Once | INTRAMUSCULAR | Status: AC
Start: 2022-02-12 — End: 2022-02-12
  Administered 2022-02-12: 16:00:00 1000 ug via INTRAMUSCULAR

## 2022-02-13 ENCOUNTER — Encounter: Admit: 2022-02-13 | Discharge: 2022-02-13 | Payer: MEDICARE | Primary: Family Medicine

## 2022-02-13 DIAGNOSIS — E538 Deficiency of other specified B group vitamins: Secondary | ICD-10-CM

## 2022-02-13 MED ORDER — CYANOCOBALAMIN 1000 MCG/ML IJ SOLN
1000 | Freq: Once | INTRAMUSCULAR | Status: AC
Start: 2022-02-13 — End: 2022-02-13
  Administered 2022-02-13: 15:00:00 1000 ug via INTRAMUSCULAR

## 2022-02-14 ENCOUNTER — Encounter: Admit: 2022-02-14 | Discharge: 2022-02-14 | Payer: MEDICARE | Primary: Family Medicine

## 2022-02-14 DIAGNOSIS — E538 Deficiency of other specified B group vitamins: Secondary | ICD-10-CM

## 2022-02-14 MED ORDER — CYANOCOBALAMIN 1000 MCG/ML IJ SOLN
1000 | Freq: Once | INTRAMUSCULAR | Status: AC
Start: 2022-02-14 — End: 2022-02-14
  Administered 2022-02-14: 16:00:00 1000 ug via INTRAMUSCULAR

## 2022-02-17 ENCOUNTER — Encounter: Admit: 2022-02-17 | Discharge: 2022-02-17 | Payer: MEDICARE | Primary: Family Medicine

## 2022-02-17 DIAGNOSIS — E538 Deficiency of other specified B group vitamins: Secondary | ICD-10-CM

## 2022-02-17 MED ORDER — CYANOCOBALAMIN 1000 MCG/ML IJ SOLN
1000 | Freq: Once | INTRAMUSCULAR | Status: AC
Start: 2022-02-17 — End: 2022-02-17
  Administered 2022-02-17: 16:00:00 1000 ug via INTRAMUSCULAR

## 2022-03-10 ENCOUNTER — Encounter: Attending: Family | Primary: Family Medicine

## 2022-03-12 ENCOUNTER — Encounter

## 2022-03-13 ENCOUNTER — Encounter

## 2022-03-13 LAB — CULTURE, WOUND

## 2022-03-14 LAB — CULTURE, WOUND, AEROBIC AND ANAEROBIC
FINAL REPORT: NORMAL
Gram Stain Result: NONE SEEN
Gram Stain Result: NONE SEEN

## 2022-03-26 LAB — CULTURE, FUNGUS
KOH result: NONE SEEN
KOH result: NONE SEEN

## 2022-03-27 LAB — CULTURE, WOUND
FINAL REPORT: NO GROWTH
Gram Stain Result: NONE SEEN

## 2022-05-15 ENCOUNTER — Encounter: Payer: MEDICARE | Attending: Family | Primary: Family Medicine

## 2022-07-07 DEATH — deceased
